# Patient Record
Sex: Male | Born: 1935 | Race: White | Hispanic: No | Smoking: Former smoker
Health system: Southern US, Community
[De-identification: ages and names within clinical notes are randomized; demographics above are authoritative.]

## PROBLEM LIST (undated history)

## (undated) DIAGNOSIS — J986 Disorders of diaphragm: Secondary | ICD-10-CM

## (undated) DIAGNOSIS — I714 Abdominal aortic aneurysm, without rupture, unspecified: Secondary | ICD-10-CM

## (undated) DIAGNOSIS — Z77090 Contact with and (suspected) exposure to asbestos: Secondary | ICD-10-CM

## (undated) DIAGNOSIS — I519 Heart disease, unspecified: Secondary | ICD-10-CM

## (undated) DIAGNOSIS — R0602 Shortness of breath: Secondary | ICD-10-CM

## (undated) DIAGNOSIS — I2510 Atherosclerotic heart disease of native coronary artery without angina pectoris: Secondary | ICD-10-CM

## (undated) DIAGNOSIS — M1990 Unspecified osteoarthritis, unspecified site: Secondary | ICD-10-CM

## (undated) DIAGNOSIS — N400 Enlarged prostate without lower urinary tract symptoms: Secondary | ICD-10-CM

## (undated) DIAGNOSIS — J449 Chronic obstructive pulmonary disease, unspecified: Secondary | ICD-10-CM

## (undated) DIAGNOSIS — R972 Elevated prostate specific antigen [PSA]: Secondary | ICD-10-CM

## (undated) DIAGNOSIS — B009 Herpesviral infection, unspecified: Secondary | ICD-10-CM

## (undated) HISTORY — DX: Elevated prostate specific antigen (PSA): R97.20

## (undated) HISTORY — DX: Shortness of breath: R06.02

## (undated) HISTORY — DX: Abdominal aortic aneurysm, without rupture, unspecified: I71.40

## (undated) HISTORY — DX: Herpesviral infection, unspecified: B00.9

## (undated) HISTORY — DX: Unspecified osteoarthritis, unspecified site: M19.90

## (undated) HISTORY — DX: Benign prostatic hyperplasia without lower urinary tract symptoms: N40.0

## (undated) HISTORY — DX: Disorders of diaphragm: J98.6

## (undated) HISTORY — DX: Chronic obstructive pulmonary disease, unspecified: J44.9

## (undated) HISTORY — DX: Contact with and (suspected) exposure to asbestos: Z77.090

## (undated) HISTORY — DX: Heart disease, unspecified: I51.9

## (undated) HISTORY — DX: Atherosclerotic heart disease of native coronary artery without angina pectoris: I25.10

## (undated) HISTORY — DX: Abdominal aortic aneurysm, without rupture: I71.4

---
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
## 2008-02-25 DIAGNOSIS — B009 Herpesviral infection, unspecified: Secondary | ICD-10-CM

## 2008-02-25 HISTORY — DX: Herpesviral infection, unspecified: B00.9

## 2008-06-09 ENCOUNTER — Ambulatory Visit: Payer: Self-pay | Admitting: Cardiothoracic Surgery

## 2008-06-09 ENCOUNTER — Ambulatory Visit: Payer: Self-pay | Admitting: Internal Medicine

## 2008-06-09 ENCOUNTER — Inpatient Hospital Stay (HOSPITAL_COMMUNITY): Admission: EM | Admit: 2008-06-09 | Discharge: 2008-07-02 | Payer: Self-pay | Admitting: Emergency Medicine

## 2008-06-09 ENCOUNTER — Encounter (INDEPENDENT_AMBULATORY_CARE_PROVIDER_SITE_OTHER): Payer: Self-pay | Admitting: Emergency Medicine

## 2008-06-10 ENCOUNTER — Encounter (INDEPENDENT_AMBULATORY_CARE_PROVIDER_SITE_OTHER): Payer: Self-pay | Admitting: Internal Medicine

## 2008-06-11 ENCOUNTER — Telehealth: Payer: Self-pay | Admitting: Internal Medicine

## 2008-06-17 HISTORY — PX: CARDIAC CATHETERIZATION: SHX172

## 2008-06-19 ENCOUNTER — Ambulatory Visit: Payer: Self-pay | Admitting: Internal Medicine

## 2008-06-20 ENCOUNTER — Encounter (INDEPENDENT_AMBULATORY_CARE_PROVIDER_SITE_OTHER): Payer: Self-pay | Admitting: Internal Medicine

## 2008-06-20 ENCOUNTER — Encounter: Payer: Self-pay | Admitting: Cardiology

## 2008-06-24 HISTORY — PX: CORONARY ARTERY BYPASS GRAFT: SHX141

## 2008-07-25 ENCOUNTER — Encounter: Admission: RE | Admit: 2008-07-25 | Discharge: 2008-07-25 | Payer: Self-pay | Admitting: Cardiothoracic Surgery

## 2008-07-25 ENCOUNTER — Ambulatory Visit: Payer: Self-pay | Admitting: Cardiothoracic Surgery

## 2008-09-03 HISTORY — PX: US ECHOCARDIOGRAPHY: HXRAD669

## 2009-09-15 ENCOUNTER — Ambulatory Visit: Payer: Self-pay | Admitting: Cardiology

## 2009-11-06 ENCOUNTER — Encounter: Admission: RE | Admit: 2009-11-06 | Discharge: 2009-11-06 | Payer: Self-pay | Admitting: Cardiology

## 2010-02-01 IMAGING — CR DG ABDOMEN 1V
2 series · 2 of 2 positions shown · non-contrast
Comparison: 06/13/2008.

CLINICAL DATA: Reassess bowel gas pattern.

ABDOMEN - 1 VIEW

[view not recorded (1 of 2)]
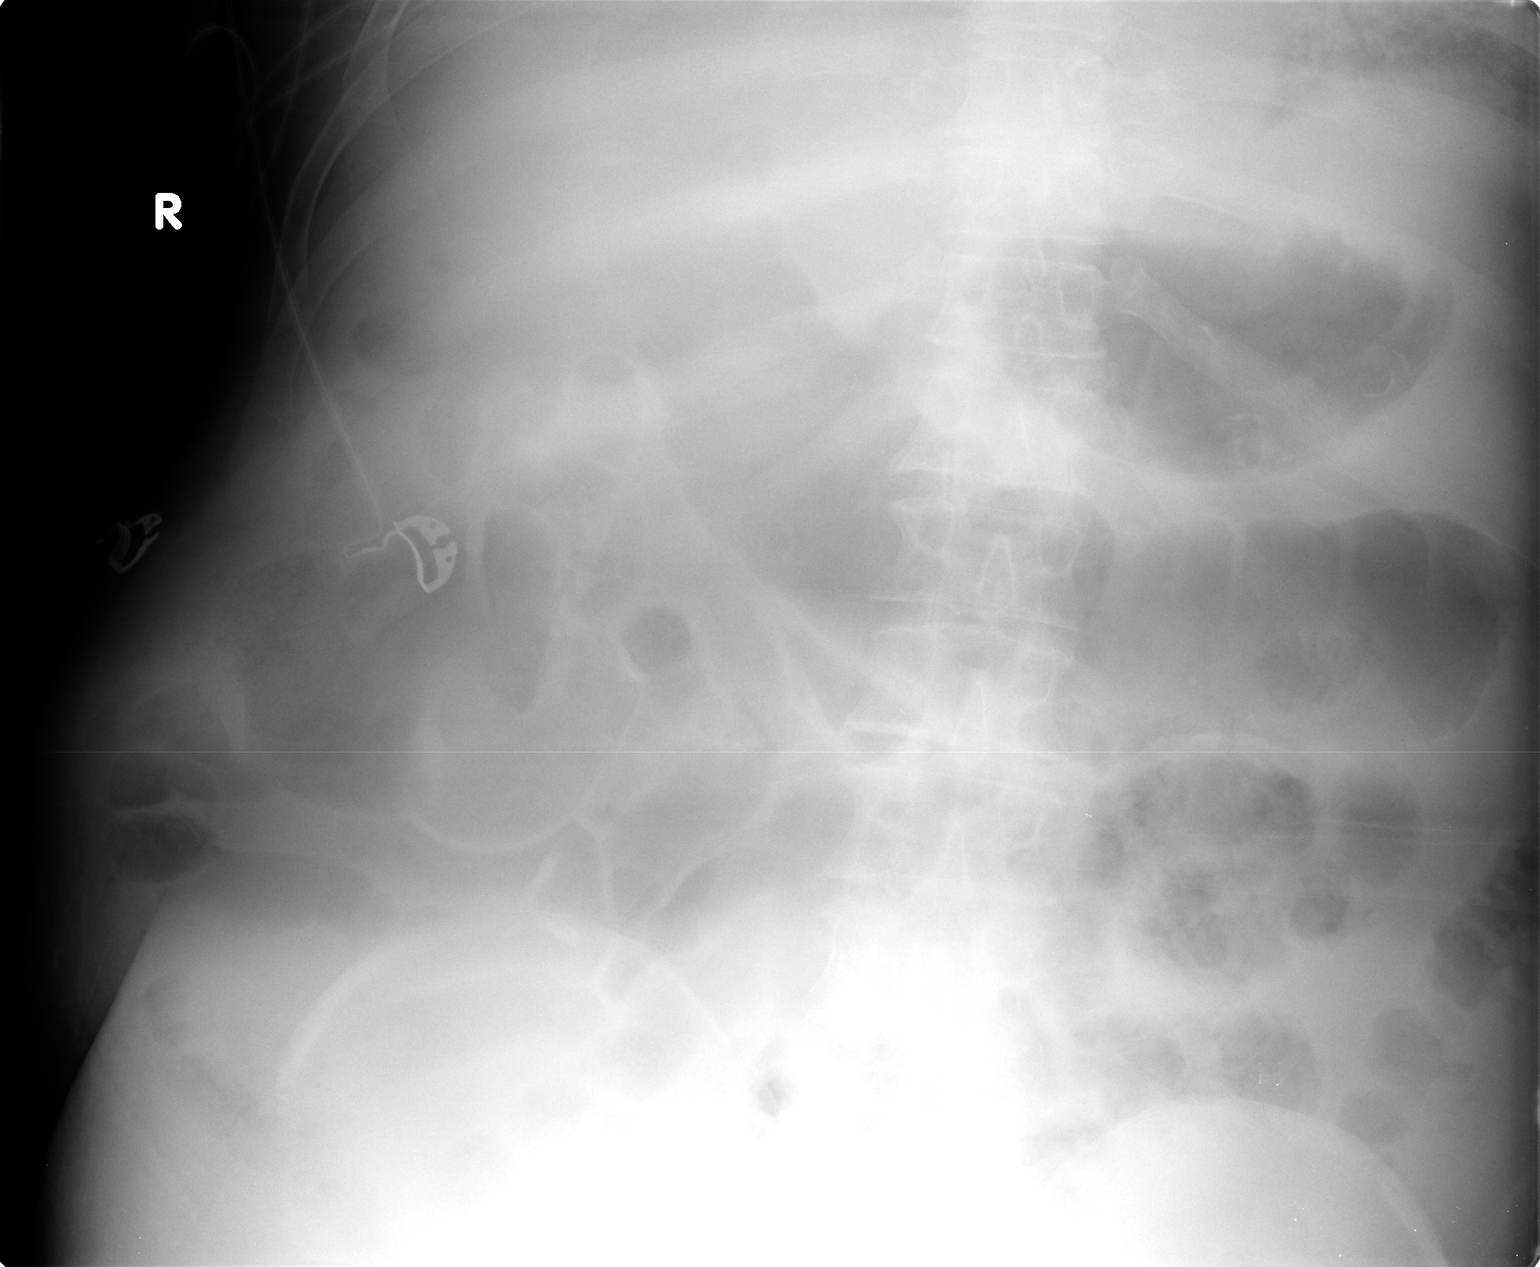

[view not recorded (2 of 2)]
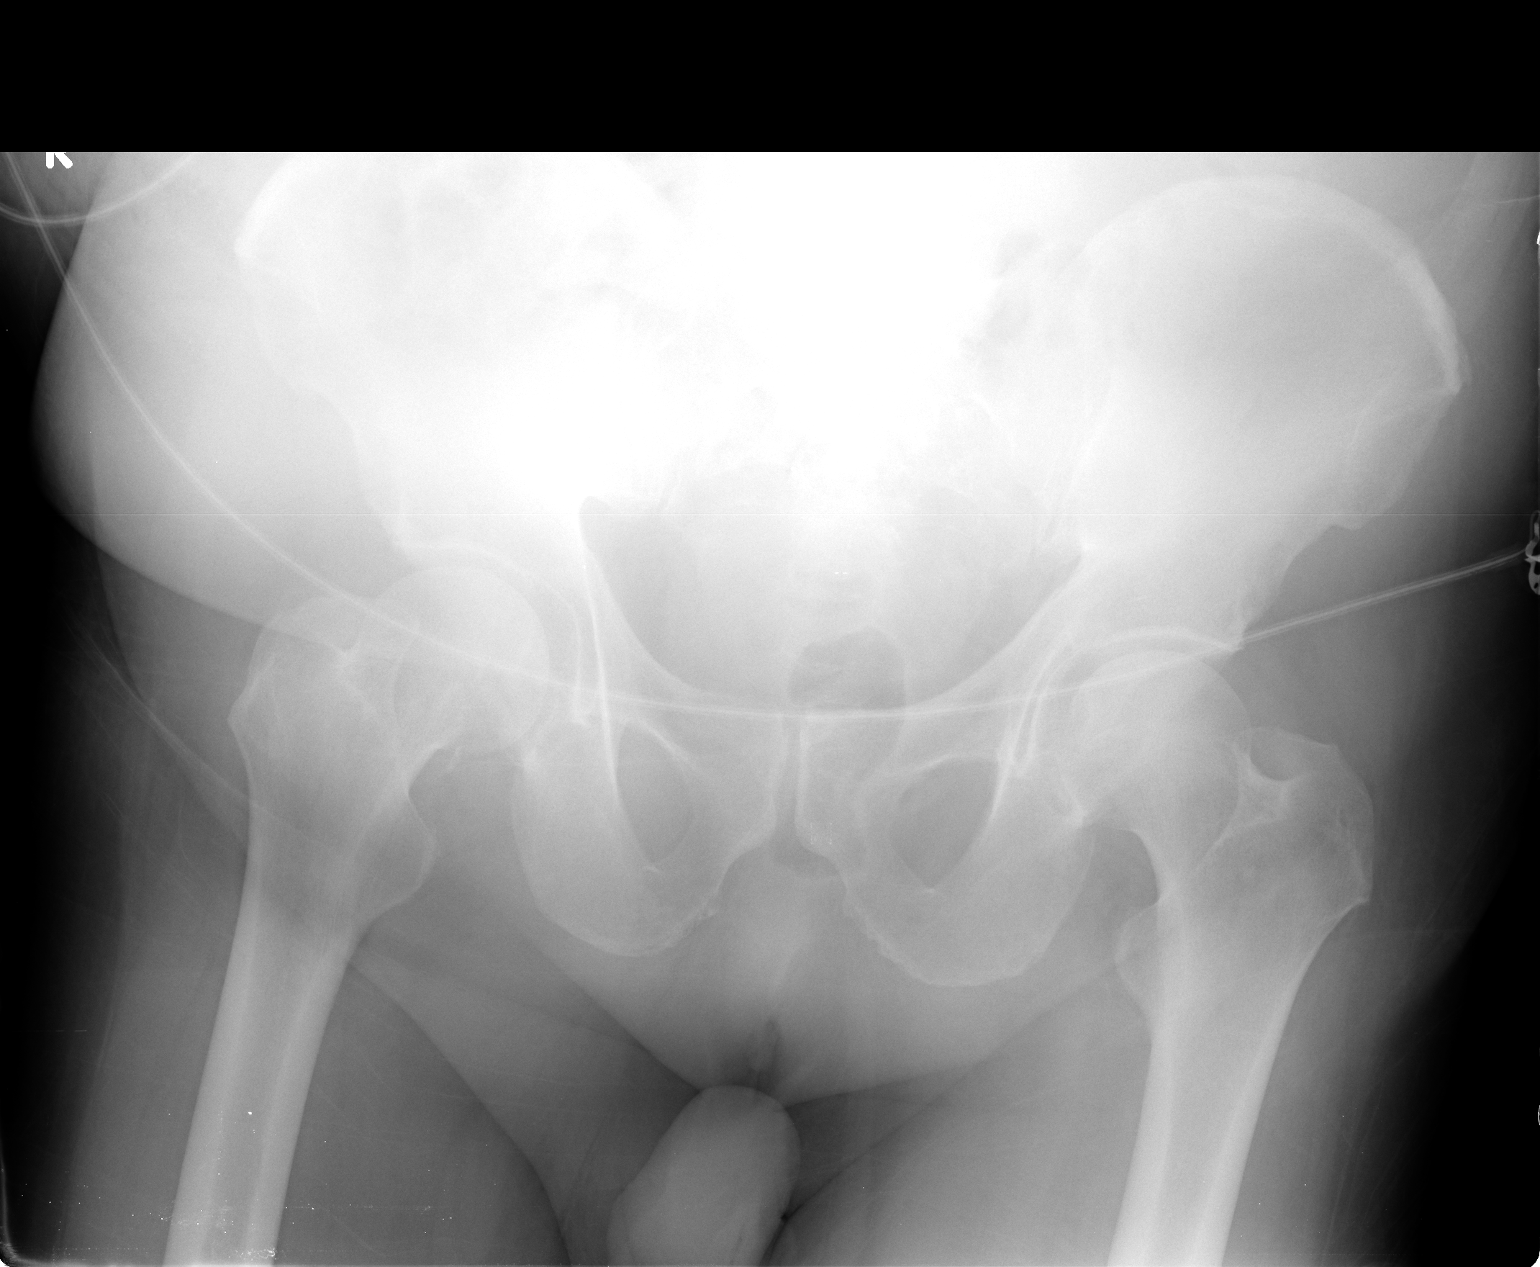

[2 of 2 positions shown; findings below may reference images not displayed]

FINDINGS: Some improvement in colonic distention.  Mild to moderate
gas distention of the transverse colon and multiple small bowel
loops compatible with an ileus.  No definite free air.
IMPRESSION: Some improvement in colonic ileus.  Findings consistent with a
generalized ileus now.

## 2010-02-11 IMAGING — CR DG CHEST 2V
2 series · 2 of 2 positions shown · non-contrast
Comparison: 06/28/2008

CLINICAL DATA: Status post CABG

CHEST - 2 VIEW

[w chest pa]
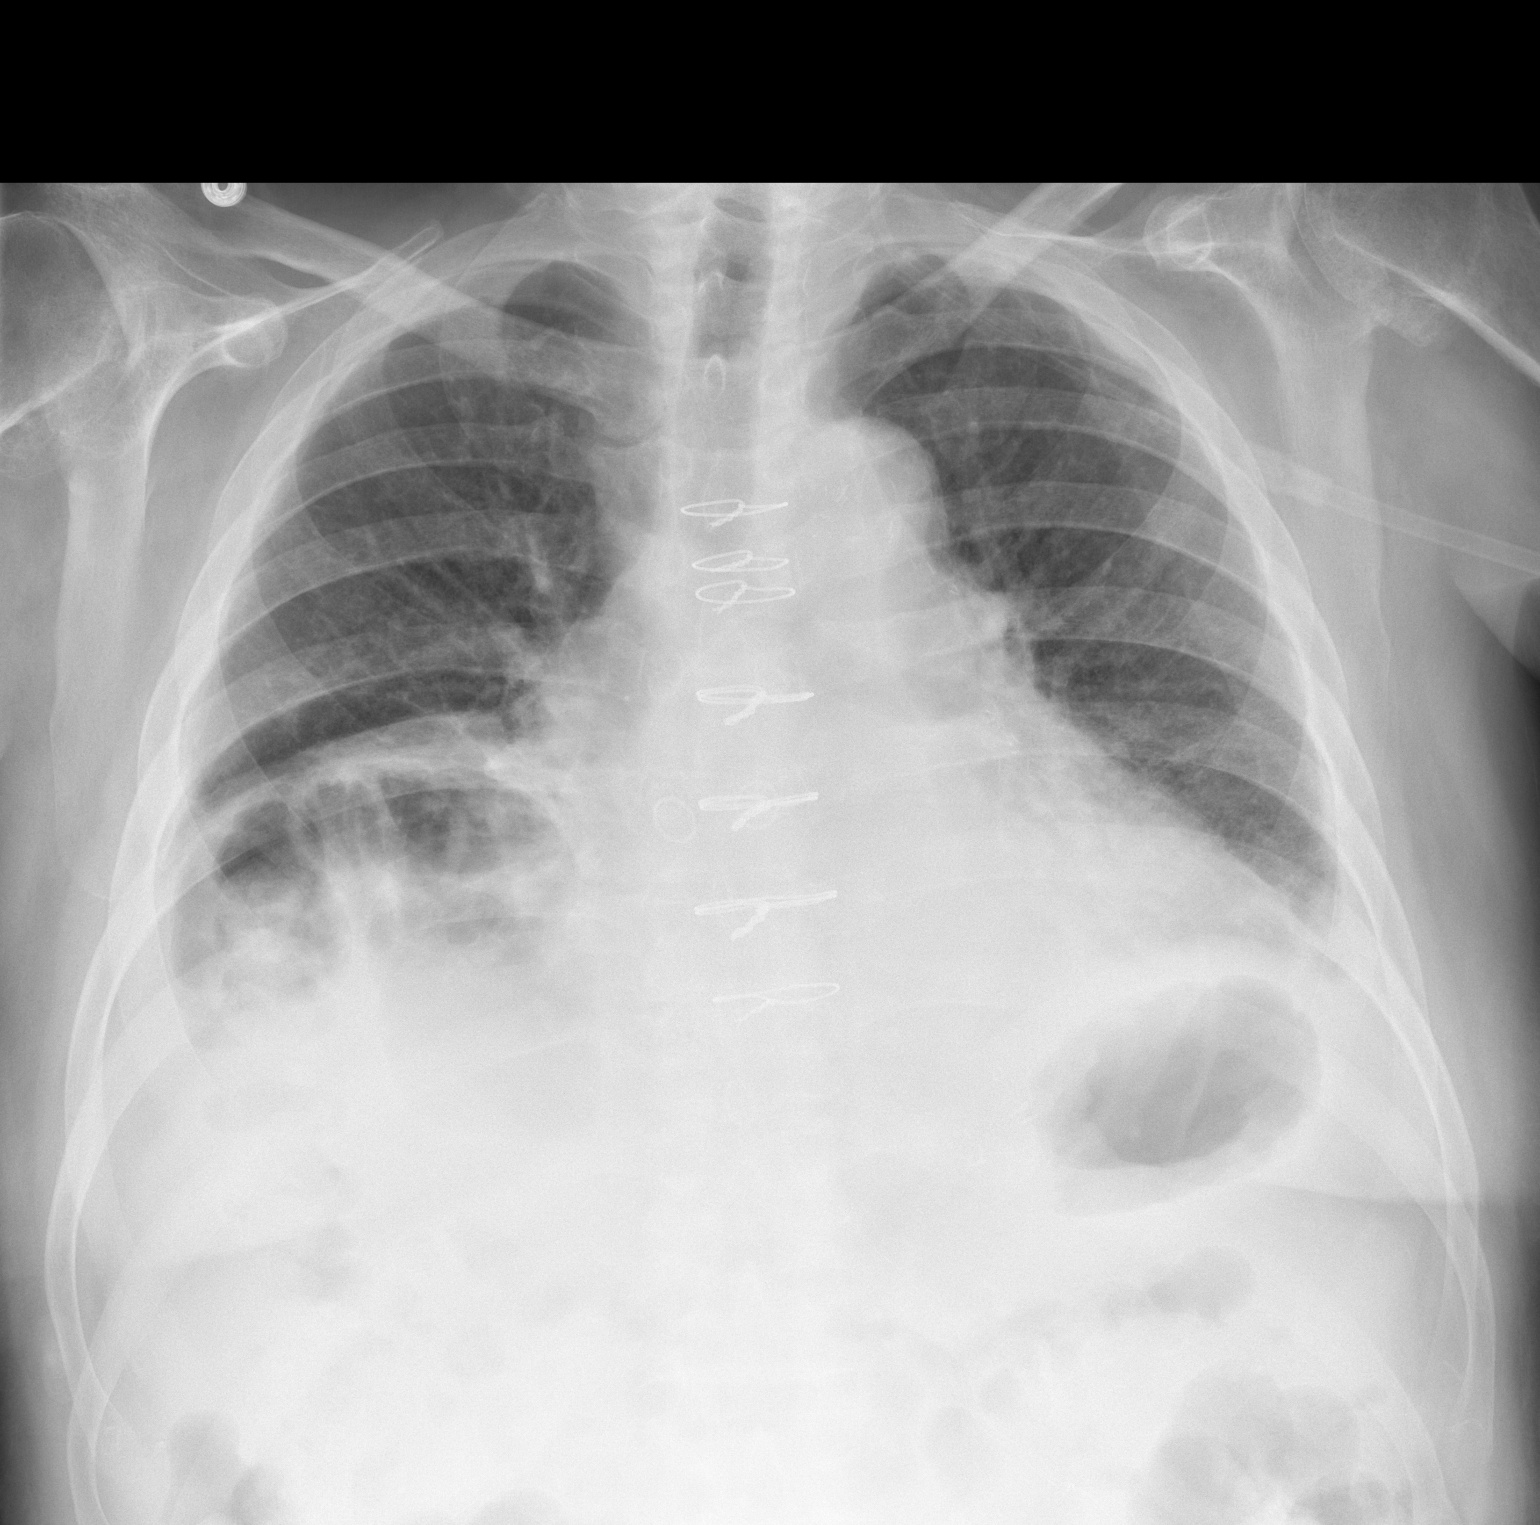

[w chest lat]
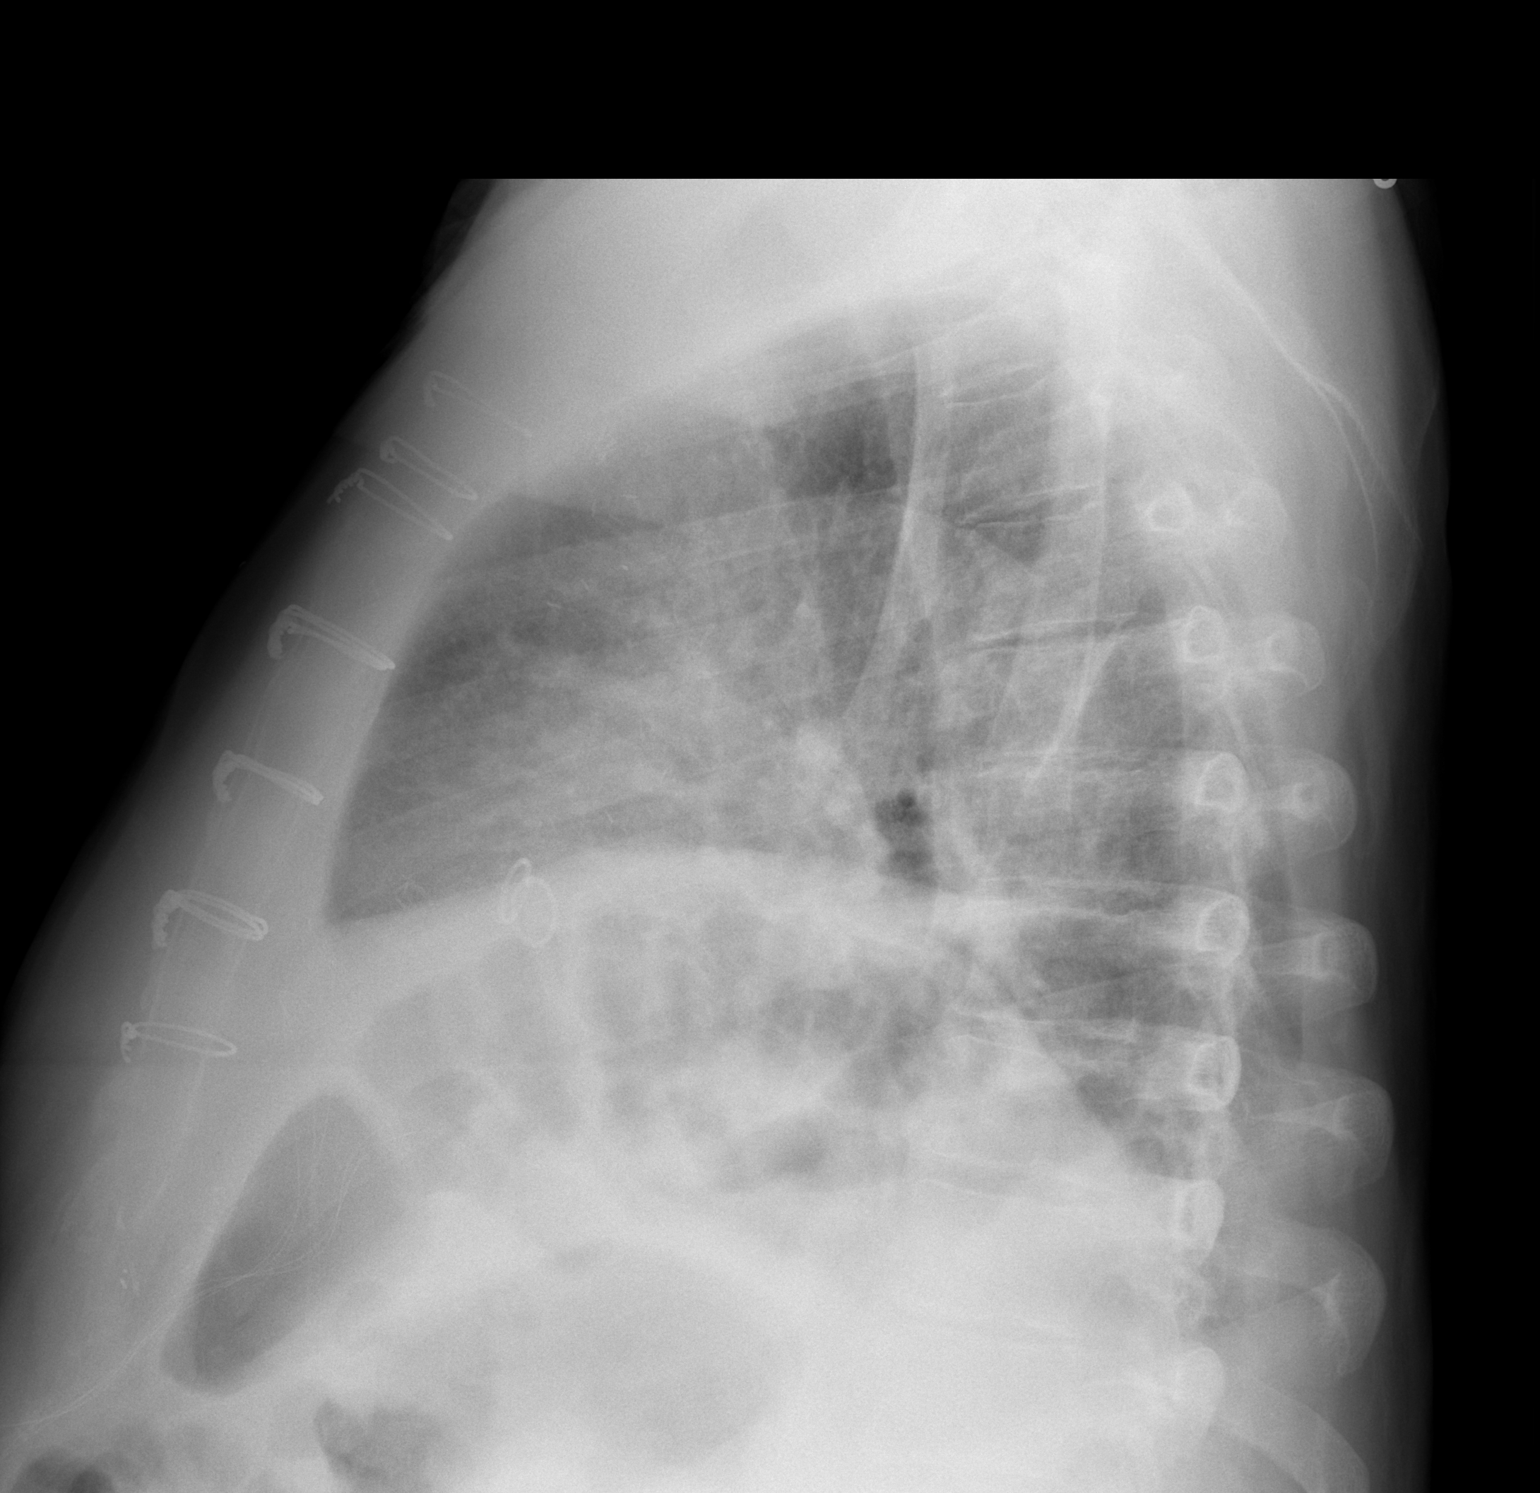

[2 of 2 positions shown; findings below may reference images not displayed]

FINDINGS: Right jugular sheath has been removed.  There is stable
elevation of the right hemidiaphragm.  Left lower lobe atelectasis
shows slight improvement.  No edema, pneumothorax or significant
pleural fluid.  Heart size and mediastinal contours are stable.
IMPRESSION: Improved aeration of left lower lobe.  No pneumothorax.

## 2010-03-19 ENCOUNTER — Ambulatory Visit (INDEPENDENT_AMBULATORY_CARE_PROVIDER_SITE_OTHER): Payer: Medicare Other | Admitting: Cardiology

## 2010-03-19 DIAGNOSIS — I2510 Atherosclerotic heart disease of native coronary artery without angina pectoris: Secondary | ICD-10-CM

## 2010-05-03 LAB — BASIC METABOLIC PANEL
BUN: 10 mg/dL (ref 6–23)
BUN: 11 mg/dL (ref 6–23)
BUN: 7 mg/dL (ref 6–23)
BUN: 7 mg/dL (ref 6–23)
BUN: 9 mg/dL (ref 6–23)
CO2: 26 mEq/L (ref 19–32)
CO2: 29 mEq/L (ref 19–32)
CO2: 30 mEq/L (ref 19–32)
CO2: 30 mEq/L (ref 19–32)
CO2: 31 mEq/L (ref 19–32)
Calcium: 7.5 mg/dL — ABNORMAL LOW (ref 8.4–10.5)
Calcium: 7.8 mg/dL — ABNORMAL LOW (ref 8.4–10.5)
Calcium: 8.1 mg/dL — ABNORMAL LOW (ref 8.4–10.5)
Calcium: 8.4 mg/dL (ref 8.4–10.5)
Calcium: 8.7 mg/dL (ref 8.4–10.5)
Chloride: 104 mEq/L (ref 96–112)
Chloride: 105 mEq/L (ref 96–112)
Chloride: 108 mEq/L (ref 96–112)
Chloride: 108 mEq/L (ref 96–112)
Chloride: 109 mEq/L (ref 96–112)
Creatinine, Ser: 0.64 mg/dL (ref 0.4–1.5)
Creatinine, Ser: 0.7 mg/dL (ref 0.4–1.5)
Creatinine, Ser: 0.71 mg/dL (ref 0.4–1.5)
Creatinine, Ser: 0.77 mg/dL (ref 0.4–1.5)
Creatinine, Ser: 0.82 mg/dL (ref 0.4–1.5)
GFR calc Af Amer: >60 mL/min (ref >60)
GFR calc Af Amer: >60 mL/min (ref >60)
GFR calc Af Amer: >60 mL/min (ref >60)
GFR calc Af Amer: >60 mL/min (ref >60)
GFR calc Af Amer: >60 mL/min (ref >60)
GFR calc non Af Amer: >60 mL/min (ref >60)
GFR calc non Af Amer: >60 mL/min (ref >60)
GFR calc non Af Amer: >60 mL/min (ref >60)
GFR calc non Af Amer: >60 mL/min (ref >60)
GFR calc non Af Amer: >60 mL/min (ref >60)
Glucose, Bld: 101 mg/dL — ABNORMAL HIGH (ref 70–99)
Glucose, Bld: 104 mg/dL — ABNORMAL HIGH (ref 70–99)
Glucose, Bld: 107 mg/dL — ABNORMAL HIGH (ref 70–99)
Glucose, Bld: 107 mg/dL — ABNORMAL HIGH (ref 70–99)
Glucose, Bld: 95 mg/dL (ref 70–99)
Potassium: 3.6 mEq/L (ref 3.5–5.1)
Potassium: 3.7 mEq/L (ref 3.5–5.1)
Potassium: 3.8 mEq/L (ref 3.5–5.1)
Potassium: 3.9 mEq/L (ref 3.5–5.1)
Potassium: 4 mEq/L (ref 3.5–5.1)
Sodium: 138 mEq/L (ref 135–145)
Sodium: 139 mEq/L (ref 135–145)
Sodium: 141 mEq/L (ref 135–145)
Sodium: 143 mEq/L (ref 135–145)
Sodium: 143 mEq/L (ref 135–145)

## 2010-05-03 LAB — CBC
HCT: 26.1 % — ABNORMAL LOW (ref 39.0–52.0)
HCT: 27.4 % — ABNORMAL LOW (ref 39.0–52.0)
HCT: 27.5 % — ABNORMAL LOW (ref 39.0–52.0)
HCT: 27.8 % — ABNORMAL LOW (ref 39.0–52.0)
HCT: 27.9 % — ABNORMAL LOW (ref 39.0–52.0)
HCT: 28.3 % — ABNORMAL LOW (ref 39.0–52.0)
HCT: 28.7 % — ABNORMAL LOW (ref 39.0–52.0)
HCT: 29.1 % — ABNORMAL LOW (ref 39.0–52.0)
HCT: 32.3 % — ABNORMAL LOW (ref 39.0–52.0)
Hemoglobin: 10.6 g/dL — ABNORMAL LOW (ref 13.0–17.0)
Hemoglobin: 8.6 g/dL — ABNORMAL LOW (ref 13.0–17.0)
Hemoglobin: 9.1 g/dL — ABNORMAL LOW (ref 13.0–17.0)
Hemoglobin: 9.2 g/dL — ABNORMAL LOW (ref 13.0–17.0)
Hemoglobin: 9.3 g/dL — ABNORMAL LOW (ref 13.0–17.0)
Hemoglobin: 9.3 g/dL — ABNORMAL LOW (ref 13.0–17.0)
Hemoglobin: 9.4 g/dL — ABNORMAL LOW (ref 13.0–17.0)
Hemoglobin: 9.5 g/dL — ABNORMAL LOW (ref 13.0–17.0)
Hemoglobin: 9.7 g/dL — ABNORMAL LOW (ref 13.0–17.0)
Hemoglobin: 9.7 g/dL — ABNORMAL LOW (ref 13.0–17.0)
MCHC: 33 g/dL (ref 30.0–36.0)
MCHC: 33 g/dL (ref 30.0–36.0)
MCHC: 33.1 g/dL (ref 30.0–36.0)
MCHC: 33.1 g/dL (ref 30.0–36.0)
MCHC: 33.1 g/dL (ref 30.0–36.0)
MCHC: 33.3 g/dL (ref 30.0–36.0)
MCHC: 33.4 g/dL (ref 30.0–36.0)
MCHC: 33.4 g/dL (ref 30.0–36.0)
MCHC: 33.5 g/dL (ref 30.0–36.0)
MCHC: 33.5 g/dL (ref 30.0–36.0)
MCV: 80.4 fL (ref 78.0–100.0)
MCV: 80.7 fL (ref 78.0–100.0)
MCV: 81 fL (ref 78.0–100.0)
MCV: 81.1 fL (ref 78.0–100.0)
MCV: 81.6 fL (ref 78.0–100.0)
MCV: 81.6 fL (ref 78.0–100.0)
MCV: 81.9 fL (ref 78.0–100.0)
MCV: 81.9 fL (ref 78.0–100.0)
MCV: 82 fL (ref 78.0–100.0)
Platelets: 182 10*3/uL (ref 150–400)
Platelets: 183 10*3/uL (ref 150–400)
Platelets: 187 10*3/uL (ref 150–400)
Platelets: 188 10*3/uL (ref 150–400)
Platelets: 218 10*3/uL (ref 150–400)
Platelets: 228 10*3/uL (ref 150–400)
Platelets: 238 10*3/uL (ref 150–400)
Platelets: 256 10*3/uL (ref 150–400)
Platelets: 358 10*3/uL (ref 150–400)
RBC: 3.19 MIL/uL — ABNORMAL LOW (ref 4.22–5.81)
RBC: 3.36 MIL/uL — ABNORMAL LOW (ref 4.22–5.81)
RBC: 3.39 MIL/uL — ABNORMAL LOW (ref 4.22–5.81)
RBC: 3.4 MIL/uL — ABNORMAL LOW (ref 4.22–5.81)
RBC: 3.41 MIL/uL — ABNORMAL LOW (ref 4.22–5.81)
RBC: 3.46 MIL/uL — ABNORMAL LOW (ref 4.22–5.81)
RBC: 3.58 MIL/uL — ABNORMAL LOW (ref 4.22–5.81)
RBC: 3.61 MIL/uL — ABNORMAL LOW (ref 4.22–5.81)
RBC: 3.99 MIL/uL — ABNORMAL LOW (ref 4.22–5.81)
RDW: 16.4 % — ABNORMAL HIGH (ref 11.5–15.5)
RDW: 16.4 % — ABNORMAL HIGH (ref 11.5–15.5)
RDW: 16.6 % — ABNORMAL HIGH (ref 11.5–15.5)
RDW: 16.7 % — ABNORMAL HIGH (ref 11.5–15.5)
RDW: 16.9 % — ABNORMAL HIGH (ref 11.5–15.5)
RDW: 16.9 % — ABNORMAL HIGH (ref 11.5–15.5)
RDW: 17 % — ABNORMAL HIGH (ref 11.5–15.5)
RDW: 17.2 % — ABNORMAL HIGH (ref 11.5–15.5)
RDW: 17.8 % — ABNORMAL HIGH (ref 11.5–15.5)
RDW: 18.2 % — ABNORMAL HIGH (ref 11.5–15.5)
WBC: 10.5 10*3/uL (ref 4.0–10.5)
WBC: 10.7 10*3/uL — ABNORMAL HIGH (ref 4.0–10.5)
WBC: 14.7 10*3/uL — ABNORMAL HIGH (ref 4.0–10.5)
WBC: 22 10*3/uL — ABNORMAL HIGH (ref 4.0–10.5)
WBC: 7.5 10*3/uL (ref 4.0–10.5)
WBC: 8.5 10*3/uL (ref 4.0–10.5)
WBC: 8.7 10*3/uL (ref 4.0–10.5)
WBC: 9.1 10*3/uL (ref 4.0–10.5)
WBC: 9.6 10*3/uL (ref 4.0–10.5)

## 2010-05-03 LAB — COMPREHENSIVE METABOLIC PANEL
ALT: 12 U/L (ref 0–53)
ALT: <8 U/L (ref 0–53)
AST: 20 U/L (ref 0–37)
Albumin: 2.3 g/dL — ABNORMAL LOW (ref 3.5–5.2)
Alkaline Phosphatase: 61 U/L (ref 39–117)
BUN: 6 mg/dL (ref 6–23)
CO2: 29 mEq/L (ref 19–32)
Calcium: 8.5 mg/dL (ref 8.4–10.5)
Calcium: 9 mg/dL (ref 8.4–10.5)
Chloride: 106 mEq/L (ref 96–112)
Creatinine, Ser: 0.76 mg/dL (ref 0.4–1.5)
Creatinine, Ser: 0.86 mg/dL (ref 0.4–1.5)
GFR calc Af Amer: >60 mL/min (ref >60)
GFR calc Af Amer: >60 mL/min (ref >60)
GFR calc non Af Amer: >60 mL/min (ref >60)
Glucose, Bld: 105 mg/dL — ABNORMAL HIGH (ref 70–99)
Glucose, Bld: 112 mg/dL — ABNORMAL HIGH (ref 70–99)
Potassium: 4.4 mEq/L (ref 3.5–5.1)
Sodium: 140 mEq/L (ref 135–145)
Sodium: 143 mEq/L (ref 135–145)
Total Bilirubin: 0.3 mg/dL (ref 0.3–1.2)
Total Protein: 5.9 g/dL — ABNORMAL LOW (ref 6.0–8.3)
Total Protein: 6.2 g/dL (ref 6.0–8.3)

## 2010-05-03 LAB — POCT I-STAT 3, ART BLOOD GAS (G3+)
Acid-Base Excess: 1 mmol/L (ref 0.0–2.0)
Acid-base deficit: 1 mmol/L (ref 0.0–2.0)
Bicarbonate: 25.1 mEq/L — ABNORMAL HIGH (ref 20.0–24.0)
Bicarbonate: 25.6 mEq/L — ABNORMAL HIGH (ref 20.0–24.0)
Bicarbonate: 26.5 mEq/L — ABNORMAL HIGH (ref 20.0–24.0)
O2 Saturation: 100 %
O2 Saturation: 94 %
Patient temperature: 36.7
TCO2: 26 mmol/L (ref 0–100)
TCO2: 27 mmol/L (ref 0–100)
TCO2: 28 mmol/L (ref 0–100)
pCO2 arterial: 44.6 mmHg (ref 35.0–45.0)
pH, Arterial: 7.331 — ABNORMAL LOW (ref 7.350–7.450)
pH, Arterial: 7.361 (ref 7.350–7.450)
pH, Arterial: 7.373 (ref 7.350–7.450)
pH, Arterial: 7.386 (ref 7.350–7.450)
pH, Arterial: 7.396 (ref 7.350–7.450)
pH, Arterial: 7.401 (ref 7.350–7.450)
pH, Arterial: 7.41 (ref 7.350–7.450)
pO2, Arterial: 314 mmHg — ABNORMAL HIGH (ref 80.0–100.0)
pO2, Arterial: 72 mmHg — ABNORMAL LOW (ref 80.0–100.0)

## 2010-05-03 LAB — POCT I-STAT, CHEM 8
BUN: 10 mg/dL (ref 6–23)
BUN: 8 mg/dL (ref 6–23)
Calcium, Ion: 1.18 mmol/L (ref 1.12–1.32)
Creatinine, Ser: 1 mg/dL (ref 0.4–1.5)
Glucose, Bld: 143 mg/dL — ABNORMAL HIGH (ref 70–99)
HCT: 31 % — ABNORMAL LOW (ref 39.0–52.0)
Hemoglobin: 10.5 g/dL — ABNORMAL LOW (ref 13.0–17.0)
Sodium: 138 mEq/L (ref 135–145)
TCO2: 27 mmol/L (ref 0–100)

## 2010-05-03 LAB — GLUCOSE, CAPILLARY
Glucose-Capillary: 104 mg/dL — ABNORMAL HIGH (ref 70–99)
Glucose-Capillary: 105 mg/dL — ABNORMAL HIGH (ref 70–99)
Glucose-Capillary: 107 mg/dL — ABNORMAL HIGH (ref 70–99)
Glucose-Capillary: 108 mg/dL — ABNORMAL HIGH (ref 70–99)
Glucose-Capillary: 108 mg/dL — ABNORMAL HIGH (ref 70–99)
Glucose-Capillary: 109 mg/dL — ABNORMAL HIGH (ref 70–99)
Glucose-Capillary: 112 mg/dL — ABNORMAL HIGH (ref 70–99)
Glucose-Capillary: 112 mg/dL — ABNORMAL HIGH (ref 70–99)
Glucose-Capillary: 117 mg/dL — ABNORMAL HIGH (ref 70–99)
Glucose-Capillary: 128 mg/dL — ABNORMAL HIGH (ref 70–99)
Glucose-Capillary: 129 mg/dL — ABNORMAL HIGH (ref 70–99)
Glucose-Capillary: 129 mg/dL — ABNORMAL HIGH (ref 70–99)
Glucose-Capillary: 132 mg/dL — ABNORMAL HIGH (ref 70–99)
Glucose-Capillary: 143 mg/dL — ABNORMAL HIGH (ref 70–99)
Glucose-Capillary: 68 mg/dL — ABNORMAL LOW (ref 70–99)
Glucose-Capillary: 74 mg/dL (ref 70–99)
Glucose-Capillary: 88 mg/dL (ref 70–99)
Glucose-Capillary: 89 mg/dL (ref 70–99)
Glucose-Capillary: 99 mg/dL (ref 70–99)
Glucose-Capillary: 99 mg/dL (ref 70–99)
Glucose-Capillary: 99 mg/dL (ref 70–99)

## 2010-05-03 LAB — POCT I-STAT 3, VENOUS BLOOD GAS (G3P V)
Acid-base deficit: 1 mmol/L (ref 0.0–2.0)
Acid-base deficit: 1 mmol/L (ref 0.0–2.0)
O2 Saturation: 72 %
Patient temperature: 36
pH, Ven: 7.272 (ref 7.250–7.300)
pO2, Ven: 43 mmHg (ref 30.0–45.0)

## 2010-05-03 LAB — CREATININE, SERUM
Creatinine, Ser: 0.7 mg/dL (ref 0.4–1.5)
Creatinine, Ser: 0.82 mg/dL (ref 0.4–1.5)
GFR calc Af Amer: >60 mL/min (ref >60)
GFR calc Af Amer: >60 mL/min (ref >60)
GFR calc non Af Amer: >60 mL/min (ref >60)
GFR calc non Af Amer: >60 mL/min (ref >60)

## 2010-05-03 LAB — POCT I-STAT 4, (NA,K, GLUC, HGB,HCT)
Glucose, Bld: 121 mg/dL — ABNORMAL HIGH (ref 70–99)
Glucose, Bld: 128 mg/dL — ABNORMAL HIGH (ref 70–99)
Glucose, Bld: 142 mg/dL — ABNORMAL HIGH (ref 70–99)
Glucose, Bld: 142 mg/dL — ABNORMAL HIGH (ref 70–99)
Glucose, Bld: 148 mg/dL — ABNORMAL HIGH (ref 70–99)
HCT: 22 % — ABNORMAL LOW (ref 39.0–52.0)
HCT: 26 % — ABNORMAL LOW (ref 39.0–52.0)
HCT: 26 % — ABNORMAL LOW (ref 39.0–52.0)
HCT: 27 % — ABNORMAL LOW (ref 39.0–52.0)
HCT: 34 % — ABNORMAL LOW (ref 39.0–52.0)
Hemoglobin: 11.6 g/dL — ABNORMAL LOW (ref 13.0–17.0)
Hemoglobin: 6.8 g/dL — CL (ref 13.0–17.0)
Hemoglobin: 7.5 g/dL — CL (ref 13.0–17.0)
Hemoglobin: 8.8 g/dL — ABNORMAL LOW (ref 13.0–17.0)
Hemoglobin: 9.2 g/dL — ABNORMAL LOW (ref 13.0–17.0)
Potassium: 4.1 mEq/L (ref 3.5–5.1)
Potassium: 4.1 mEq/L (ref 3.5–5.1)
Potassium: 4.1 mEq/L (ref 3.5–5.1)
Potassium: 4.2 mEq/L (ref 3.5–5.1)
Potassium: 4.3 mEq/L (ref 3.5–5.1)
Potassium: 4.6 mEq/L (ref 3.5–5.1)
Potassium: 4.9 mEq/L (ref 3.5–5.1)
Potassium: 5.2 mEq/L — ABNORMAL HIGH (ref 3.5–5.1)
Sodium: 133 mEq/L — ABNORMAL LOW (ref 135–145)
Sodium: 135 mEq/L (ref 135–145)
Sodium: 138 mEq/L (ref 135–145)
Sodium: 140 mEq/L (ref 135–145)
Sodium: 140 mEq/L (ref 135–145)

## 2010-05-03 LAB — DIFFERENTIAL
Eosinophils Absolute: 0.2 10*3/uL (ref 0.0–0.7)
Lymphocytes Relative: 21 % (ref 12–46)
Lymphs Abs: 1.5 10*3/uL (ref 0.7–4.0)
Monocytes Relative: 8 % (ref 3–12)
Neutrophils Relative %: 68 % (ref 43–77)

## 2010-05-03 LAB — PREPARE FRESH FROZEN PLASMA

## 2010-05-03 LAB — APTT: aPTT: 33 seconds (ref 24–37)

## 2010-05-03 LAB — MAGNESIUM
Magnesium: 2.6 mg/dL — ABNORMAL HIGH (ref 1.5–2.5)
Magnesium: 2.7 mg/dL — ABNORMAL HIGH (ref 1.5–2.5)
Magnesium: 3.1 mg/dL — ABNORMAL HIGH (ref 1.5–2.5)

## 2010-05-03 LAB — PROTIME-INR
INR: 1.3 (ref 0.00–1.49)
Prothrombin Time: 16.4 seconds — ABNORMAL HIGH (ref 11.6–15.2)

## 2010-05-04 LAB — BASIC METABOLIC PANEL
BUN: 18 mg/dL (ref 6–23)
CO2: 22 mEq/L (ref 19–32)
CO2: 24 mEq/L (ref 19–32)
CO2: 26 mEq/L (ref 19–32)
CO2: 26 mEq/L (ref 19–32)
Calcium: 8.5 mg/dL (ref 8.4–10.5)
Calcium: 8.7 mg/dL (ref 8.4–10.5)
Chloride: 101 mEq/L (ref 96–112)
Chloride: 106 mEq/L (ref 96–112)
Creatinine, Ser: 1.01 mg/dL (ref 0.4–1.5)
Creatinine, Ser: 1.11 mg/dL (ref 0.4–1.5)
GFR calc Af Amer: >60 mL/min (ref >60)
GFR calc Af Amer: >60 mL/min (ref >60)
GFR calc non Af Amer: >60 mL/min (ref >60)
Glucose, Bld: 114 mg/dL — ABNORMAL HIGH (ref 70–99)
Glucose, Bld: 120 mg/dL — ABNORMAL HIGH (ref 70–99)
Glucose, Bld: 148 mg/dL — ABNORMAL HIGH (ref 70–99)
Glucose, Bld: 87 mg/dL (ref 70–99)
Potassium: 4.1 mEq/L (ref 3.5–5.1)
Potassium: 4.2 mEq/L (ref 3.5–5.1)
Sodium: 140 mEq/L (ref 135–145)
Sodium: 140 mEq/L (ref 135–145)
Sodium: 143 mEq/L (ref 135–145)

## 2010-05-04 LAB — PREPARE PLATELETS

## 2010-05-04 LAB — LIPASE, BLOOD: Lipase: 17 U/L (ref 11–59)

## 2010-05-04 LAB — POCT I-STAT 3, ART BLOOD GAS (G3+)
Bicarbonate: 21.1 mEq/L (ref 20.0–24.0)
Patient temperature: 37
TCO2: 22 mmol/L (ref 0–100)
pCO2 arterial: 29.8 mmHg — ABNORMAL LOW (ref 35.0–45.0)
pH, Arterial: 7.458 — ABNORMAL HIGH (ref 7.350–7.450)

## 2010-05-04 LAB — CBC
HCT: 29.1 % — ABNORMAL LOW (ref 39.0–52.0)
HCT: 29.2 % — ABNORMAL LOW (ref 39.0–52.0)
HCT: 29.7 % — ABNORMAL LOW (ref 39.0–52.0)
HCT: 30.1 % — ABNORMAL LOW (ref 39.0–52.0)
HCT: 30.2 % — ABNORMAL LOW (ref 39.0–52.0)
HCT: 30.4 % — ABNORMAL LOW (ref 39.0–52.0)
HCT: 30.5 % — ABNORMAL LOW (ref 39.0–52.0)
HCT: 31.5 % — ABNORMAL LOW (ref 39.0–52.0)
HCT: 35.9 % — ABNORMAL LOW (ref 39.0–52.0)
Hemoglobin: 10 g/dL — ABNORMAL LOW (ref 13.0–17.0)
Hemoglobin: 10.1 g/dL — ABNORMAL LOW (ref 13.0–17.0)
Hemoglobin: 10.2 g/dL — ABNORMAL LOW (ref 13.0–17.0)
Hemoglobin: 10.2 g/dL — ABNORMAL LOW (ref 13.0–17.0)
Hemoglobin: 10.7 g/dL — ABNORMAL LOW (ref 13.0–17.0)
Hemoglobin: 12.1 g/dL — ABNORMAL LOW (ref 13.0–17.0)
MCHC: 33.1 g/dL (ref 30.0–36.0)
MCHC: 33.3 g/dL (ref 30.0–36.0)
MCHC: 33.6 g/dL (ref 30.0–36.0)
MCHC: 33.8 g/dL (ref 30.0–36.0)
MCHC: 33.8 g/dL (ref 30.0–36.0)
MCHC: 33.9 g/dL (ref 30.0–36.0)
MCHC: 34.2 g/dL (ref 30.0–36.0)
MCV: 79.6 fL (ref 78.0–100.0)
MCV: 80.2 fL (ref 78.0–100.0)
MCV: 80.3 fL (ref 78.0–100.0)
MCV: 80.4 fL (ref 78.0–100.0)
MCV: 80.5 fL (ref 78.0–100.0)
MCV: 80.5 fL (ref 78.0–100.0)
MCV: 80.9 fL (ref 78.0–100.0)
MCV: 81.3 fL (ref 78.0–100.0)
MCV: 81.4 fL (ref 78.0–100.0)
Platelets: 291 10*3/uL (ref 150–400)
Platelets: 309 10*3/uL (ref 150–400)
Platelets: 326 10*3/uL (ref 150–400)
Platelets: 331 10*3/uL (ref 150–400)
Platelets: 343 10*3/uL (ref 150–400)
Platelets: 346 10*3/uL (ref 150–400)
Platelets: 358 10*3/uL (ref 150–400)
Platelets: 399 10*3/uL (ref 150–400)
RBC: 3.6 MIL/uL — ABNORMAL LOW (ref 4.22–5.81)
RBC: 3.69 MIL/uL — ABNORMAL LOW (ref 4.22–5.81)
RBC: 3.73 MIL/uL — ABNORMAL LOW (ref 4.22–5.81)
RBC: 3.76 MIL/uL — ABNORMAL LOW (ref 4.22–5.81)
RBC: 3.79 MIL/uL — ABNORMAL LOW (ref 4.22–5.81)
RBC: 4.01 MIL/uL — ABNORMAL LOW (ref 4.22–5.81)
RBC: 4.41 MIL/uL (ref 4.22–5.81)
RDW: 15.4 % (ref 11.5–15.5)
RDW: 15.5 % (ref 11.5–15.5)
RDW: 15.5 % (ref 11.5–15.5)
RDW: 15.7 % — ABNORMAL HIGH (ref 11.5–15.5)
RDW: 15.8 % — ABNORMAL HIGH (ref 11.5–15.5)
RDW: 16.1 % — ABNORMAL HIGH (ref 11.5–15.5)
RDW: 16.3 % — ABNORMAL HIGH (ref 11.5–15.5)
WBC: 6.4 10*3/uL (ref 4.0–10.5)
WBC: 6.8 10*3/uL (ref 4.0–10.5)
WBC: 7.4 10*3/uL (ref 4.0–10.5)
WBC: 7.8 10*3/uL (ref 4.0–10.5)
WBC: 8.1 10*3/uL (ref 4.0–10.5)
WBC: 8.5 10*3/uL (ref 4.0–10.5)
WBC: 9.9 10*3/uL (ref 4.0–10.5)

## 2010-05-04 LAB — COMPREHENSIVE METABOLIC PANEL
ALT: 13 U/L (ref 0–53)
ALT: 14 U/L (ref 0–53)
ALT: 16 U/L (ref 0–53)
AST: 15 U/L (ref 0–37)
AST: 20 U/L (ref 0–37)
AST: 21 U/L (ref 0–37)
AST: 22 U/L (ref 0–37)
AST: 23 U/L (ref 0–37)
AST: 24 U/L (ref 0–37)
AST: 25 U/L (ref 0–37)
Albumin: 2 g/dL — ABNORMAL LOW (ref 3.5–5.2)
Albumin: 2.1 g/dL — ABNORMAL LOW (ref 3.5–5.2)
Albumin: 2.1 g/dL — ABNORMAL LOW (ref 3.5–5.2)
Albumin: 2.3 g/dL — ABNORMAL LOW (ref 3.5–5.2)
Albumin: 2.3 g/dL — ABNORMAL LOW (ref 3.5–5.2)
Albumin: 2.4 g/dL — ABNORMAL LOW (ref 3.5–5.2)
Alkaline Phosphatase: 51 U/L (ref 39–117)
Alkaline Phosphatase: 51 U/L (ref 39–117)
Alkaline Phosphatase: 56 U/L (ref 39–117)
Alkaline Phosphatase: 60 U/L (ref 39–117)
Alkaline Phosphatase: 60 U/L (ref 39–117)
BUN: 11 mg/dL (ref 6–23)
BUN: 12 mg/dL (ref 6–23)
BUN: 26 mg/dL — ABNORMAL HIGH (ref 6–23)
BUN: 6 mg/dL (ref 6–23)
BUN: 7 mg/dL (ref 6–23)
BUN: 8 mg/dL (ref 6–23)
CO2: 26 mEq/L (ref 19–32)
CO2: 26 mEq/L (ref 19–32)
CO2: 27 mEq/L (ref 19–32)
CO2: 28 mEq/L (ref 19–32)
Calcium: 8.4 mg/dL (ref 8.4–10.5)
Calcium: 8.4 mg/dL (ref 8.4–10.5)
Calcium: 8.7 mg/dL (ref 8.4–10.5)
Calcium: 8.8 mg/dL (ref 8.4–10.5)
Chloride: 102 mEq/L (ref 96–112)
Chloride: 104 mEq/L (ref 96–112)
Chloride: 107 mEq/L (ref 96–112)
Chloride: 112 mEq/L (ref 96–112)
Creatinine, Ser: 0.75 mg/dL (ref 0.4–1.5)
Creatinine, Ser: 0.76 mg/dL (ref 0.4–1.5)
Creatinine, Ser: 0.83 mg/dL (ref 0.4–1.5)
Creatinine, Ser: 0.89 mg/dL (ref 0.4–1.5)
Creatinine, Ser: 1.19 mg/dL (ref 0.4–1.5)
Creatinine, Ser: 1.24 mg/dL (ref 0.4–1.5)
GFR calc Af Amer: >60 mL/min (ref >60)
GFR calc Af Amer: >60 mL/min (ref >60)
GFR calc Af Amer: >60 mL/min (ref >60)
GFR calc Af Amer: >60 mL/min (ref >60)
GFR calc Af Amer: >60 mL/min (ref >60)
GFR calc Af Amer: >60 mL/min (ref >60)
GFR calc non Af Amer: 57 mL/min — ABNORMAL LOW (ref >60)
GFR calc non Af Amer: 59 mL/min — ABNORMAL LOW (ref >60)
GFR calc non Af Amer: >60 mL/min (ref >60)
GFR calc non Af Amer: >60 mL/min (ref >60)
GFR calc non Af Amer: >60 mL/min (ref >60)
GFR calc non Af Amer: >60 mL/min (ref >60)
Glucose, Bld: 100 mg/dL — ABNORMAL HIGH (ref 70–99)
Glucose, Bld: 102 mg/dL — ABNORMAL HIGH (ref 70–99)
Glucose, Bld: 115 mg/dL — ABNORMAL HIGH (ref 70–99)
Potassium: 3.1 mEq/L — ABNORMAL LOW (ref 3.5–5.1)
Potassium: 3.5 mEq/L (ref 3.5–5.1)
Potassium: 3.5 mEq/L (ref 3.5–5.1)
Potassium: 4.2 mEq/L (ref 3.5–5.1)
Potassium: 4.3 mEq/L (ref 3.5–5.1)
Sodium: 140 mEq/L (ref 135–145)
Sodium: 142 mEq/L (ref 135–145)
Total Bilirubin: 0.3 mg/dL (ref 0.3–1.2)
Total Bilirubin: 0.5 mg/dL (ref 0.3–1.2)
Total Bilirubin: 0.5 mg/dL (ref 0.3–1.2)
Total Bilirubin: 0.5 mg/dL (ref 0.3–1.2)
Total Bilirubin: 0.8 mg/dL (ref 0.3–1.2)
Total Bilirubin: 0.8 mg/dL (ref 0.3–1.2)
Total Protein: 5.8 g/dL — ABNORMAL LOW (ref 6.0–8.3)
Total Protein: 6 g/dL (ref 6.0–8.3)
Total Protein: 6 g/dL (ref 6.0–8.3)
Total Protein: 6.1 g/dL (ref 6.0–8.3)

## 2010-05-04 LAB — PROTIME-INR
INR: 1.1 (ref 0.00–1.49)
Prothrombin Time: 14.1 seconds (ref 11.6–15.2)
Prothrombin Time: 15.3 seconds — ABNORMAL HIGH (ref 11.6–15.2)

## 2010-05-04 LAB — IRON AND TIBC
Iron: 13 ug/dL — ABNORMAL LOW (ref 42–135)
Saturation Ratios: 7 % — ABNORMAL LOW (ref 20–55)
TIBC: 174 ug/dL — ABNORMAL LOW (ref 215–435)

## 2010-05-04 LAB — CROSSMATCH
ABO/RH(D): O NEG
Antibody Screen: NEGATIVE

## 2010-05-04 LAB — DIFFERENTIAL
Basophils Absolute: 0 10*3/uL (ref 0.0–0.1)
Basophils Absolute: 0 10*3/uL (ref 0.0–0.1)
Basophils Absolute: 0 10*3/uL (ref 0.0–0.1)
Basophils Absolute: 0 10*3/uL (ref 0.0–0.1)
Basophils Absolute: 0 10*3/uL (ref 0.0–0.1)
Basophils Relative: 0 % (ref 0–1)
Basophils Relative: 1 % (ref 0–1)
Basophils Relative: 1 % (ref 0–1)
Eosinophils Absolute: 0.2 10*3/uL (ref 0.0–0.7)
Eosinophils Relative: 1 % (ref 0–5)
Eosinophils Relative: 3 % (ref 0–5)
Eosinophils Relative: 4 % (ref 0–5)
Eosinophils Relative: 5 % (ref 0–5)
Lymphocytes Relative: 13 % (ref 12–46)
Lymphocytes Relative: 16 % (ref 12–46)
Lymphocytes Relative: 16 % (ref 12–46)
Lymphocytes Relative: 18 % (ref 12–46)
Lymphocytes Relative: 18 % (ref 12–46)
Lymphocytes Relative: 19 % (ref 12–46)
Lymphs Abs: 1.1 10*3/uL (ref 0.7–4.0)
Lymphs Abs: 1.2 10*3/uL (ref 0.7–4.0)
Lymphs Abs: 1.3 10*3/uL (ref 0.7–4.0)
Lymphs Abs: 1.4 10*3/uL (ref 0.7–4.0)
Monocytes Absolute: 0.5 10*3/uL (ref 0.1–1.0)
Monocytes Absolute: 0.7 10*3/uL (ref 0.1–1.0)
Monocytes Absolute: 0.7 10*3/uL (ref 0.1–1.0)
Monocytes Absolute: 1.1 10*3/uL — ABNORMAL HIGH (ref 0.1–1.0)
Monocytes Relative: 10 % (ref 3–12)
Monocytes Relative: 10 % (ref 3–12)
Monocytes Relative: 7 % (ref 3–12)
Monocytes Relative: 8 % (ref 3–12)
Neutro Abs: 4.4 10*3/uL (ref 1.7–7.7)
Neutro Abs: 4.8 10*3/uL (ref 1.7–7.7)
Neutro Abs: 4.9 10*3/uL (ref 1.7–7.7)
Neutro Abs: 5.3 10*3/uL (ref 1.7–7.7)
Neutro Abs: 5.6 10*3/uL (ref 1.7–7.7)
Neutro Abs: 6 10*3/uL (ref 1.7–7.7)
Neutrophils Relative %: 69 % (ref 43–77)
Neutrophils Relative %: 70 % (ref 43–77)
Neutrophils Relative %: 72 % (ref 43–77)

## 2010-05-04 LAB — BRAIN NATRIURETIC PEPTIDE
Pro B Natriuretic peptide (BNP): 222 pg/mL — ABNORMAL HIGH (ref 0.0–100.0)
Pro B Natriuretic peptide (BNP): 276 pg/mL — ABNORMAL HIGH (ref 0.0–100.0)
Pro B Natriuretic peptide (BNP): 320 pg/mL — ABNORMAL HIGH (ref 0.0–100.0)
Pro B Natriuretic peptide (BNP): 345 pg/mL — ABNORMAL HIGH (ref 0.0–100.0)
Pro B Natriuretic peptide (BNP): 376 pg/mL — ABNORMAL HIGH (ref 0.0–100.0)

## 2010-05-04 LAB — BLOOD GAS, ARTERIAL
Acid-base deficit: 0.8 mmol/L (ref 0.0–2.0)
Bicarbonate: 23.2 mEq/L (ref 20.0–24.0)
FIO2: 0.21 %
O2 Saturation: 91.6 %
Patient temperature: 98.3
TCO2: 24.3 mmol/L (ref 0–100)
pCO2 arterial: 36.4 mmHg (ref 35.0–45.0)
pH, Arterial: 7.419 (ref 7.350–7.450)
pO2, Arterial: 58.9 mmHg — ABNORMAL LOW (ref 80.0–100.0)

## 2010-05-04 LAB — HEMOGLOBIN A1C
Hgb A1c MFr Bld: 5.9 % (ref 4.6–6.1)
Mean Plasma Glucose: 123 mg/dL

## 2010-05-04 LAB — URINALYSIS, ROUTINE W REFLEX MICROSCOPIC
Glucose, UA: NEGATIVE mg/dL
Hgb urine dipstick: NEGATIVE
Ketones, ur: 15 mg/dL — AB
Protein, ur: 30 mg/dL — AB

## 2010-05-04 LAB — AMYLASE: Amylase: 18 U/L — ABNORMAL LOW (ref 27–131)

## 2010-05-04 LAB — URINE MICROSCOPIC-ADD ON

## 2010-05-04 LAB — POCT CARDIAC MARKERS: Troponin i, poc: <0.05 ng/mL (ref 0.00–0.09)

## 2010-05-04 LAB — GLUCOSE, CAPILLARY: Glucose-Capillary: 108 mg/dL — ABNORMAL HIGH (ref 70–99)

## 2010-05-04 LAB — CEA: CEA: 2.9 ng/mL (ref 0.0–5.0)

## 2010-05-04 LAB — PSA: PSA: 2.55 ng/mL (ref 0.10–4.00)

## 2010-05-04 LAB — URINE CULTURE: Colony Count: NO GROWTH

## 2010-05-04 LAB — APTT: aPTT: 34 seconds (ref 24–37)

## 2010-05-04 LAB — ABO/RH: ABO/RH(D): O NEG

## 2010-06-08 NOTE — H&P (Signed)
NAME:  Jermaine, Wright NO.:  [id number]   MEDICAL RECORD NO.:  [id number]          PATIENT TYPE:  INP   LOCATION:  2921                         FACILITY:  [facility]   PHYSICIAN:  Gaspar Garbe, M.D.DATE OF BIRTH:  1928/05/09   DATE OF ADMISSION:  06/09/2008  DATE OF DISCHARGE:                              HISTORY & PHYSICAL   CHIEF COMPLAINT:  Progressive shortness of breath.   HISTORY OF PRESENT ILLNESS:  The patient is a 77 year old white male,  well known to me as he is a former neighbor I have known ever since I  have arrived in [place] almost 8 years ago.  He usually comes in for  physical by once a year and has some respiratory difficulties in the  springtime consistent with allergies.  She is very phobic of  hospitalization, as he had a parent passed away in the hospital and has  been very resistant to any inpatient workups or even outpatient workups  not involving my office until this time.  He was brought to the  emergency room after having progressive shortness of breath over the  course of the weekend.  Of note, this has been followed for  approximately the past 2 months which began with an episode of shingles  for which he received treatment with Lyrica, which seemed to make him  somewhat confused as well as Vicodin, as he had resultant postherpetic  neuralgia and also had intervening breathing exacerbations and some  elevation of edema for which he had an outpatient BNP in the 300s.  It  has been very difficult to get [name] to agree to other workup  outside of his office visits and hospitalization was actually offered at  his last office visit given his progression of symptoms and this was  refused as well.  Over the course of this weekend, his shortness of  breath has gotten worse.  He has not slept well in 3 days.  He was  finally taken to the emergency room where she was evaluated.  He had a D-  dimer evaluation, which showed an  elevation, however, subsequent  pulmonary angiogram by CAT scan was inconclusive due to movement  artifacts, and therefore, provides no useful data.  He was subsequently  had a lower extremity Doppler at my request, which was negative.  His  chest x-ray also shows an elevated right hemidiaphragm, which is chronic  for him and some atelectasis on the left, but is otherwise unremarkable  for any persistent change.  He was found to have a sat 97% on room air  but being dyspneic.  I requested that the emergency room order arterial  blood gas.  This was being performed just I was seeing the patient after  the end of my office day 2 hours after I was called.  I was met in the  emergency room by the patient's daughter, Kathy Breach and his girlfriend, Carney Bern  who usually accompanies him to his visits.  They provided the majority  of the history recently, as [name] is trying to minimize his  symptoms in  an effort not to stay in the hospital.   ALLERGIES:  No known drug allergies.   MEDICATIONS:  Advair 250/50 one puff twice daily, albuterol as inhaler.  The patient's prior medications recently have involved Cymbalta, Lyrica,  and Vicodin for nerve pain and the baby aspirin, which he is not  currently taking.  He has also been set up with home health to aid for  physical therapy, occupational therapy, and home health, RN from his  last office visit.   PAST MEDICAL HISTORY:  1. COPD with an asthma component.  He seems to be fairly responsive to      beta agonist and has refused any outpatient testing for pulmonary      function tests in the past.  2. He has a considerable smoking history and asbestos exposure as      well.  3. Persistent right elevated hemidiaphragm, this was noted on his      first physical in my office, greater than 4 years ago and remains      unchanged.  4. Osteoarthritis, mostly of knees and hips as well as hands.  5. The patient has had BPH with elevated PSA.  Elevations of  PSA have      been responsive to antibiotics thus far, as he refuses further      workup.   PAST SURGICAL HISTORY:  The patient had a sebaceous cyst removed.   SOCIAL HISTORY:  The [place] the [place] near [place].  He is  with his living girlfriend, Carney Bern and he is a former Radio broadcast assistant.  He  is widowed with 3 children.  He has a 20-pack-year smoking history but  has not smoked in approximately the past 15.  Up until this year, he had  a fairly consistent alcohol intake, but Carney Bern indicates that he has not  had considerable drinking since last summer and not having any illicit  drug usage.   FAMILY HISTORY:  Mother died at age 16 of a heart attack.  Father died  at age 68 following a surgery.   REVIEW OF SYSTEMS:  The patient complains mostly of shortness of breath,  dyspnea on exertion, and swelling in his legs as well as pain in his  left upper quadrant of his abdomen/left rib margin with deep  inspiration.  The patient denies any fevers, chills, or sweats, but  family has noticed a decreased appetite and difficulty with breathing  mostly in a sitting position.  He actually breathes better with lying  flat.   ADVANCED DIRECTIVES:  The patient is a full code.   PHYSICAL EXAMINATION:  VITAL SIGNS:  Temperature 97.6, pulse 105,  respiratory rate 22, blood pressure 118/72, sating 94% on 2 L.  GENERAL:  The patient appears to be somewhat tired.  He is, however,  conversant and mentally clear.  HEENT:  Normocephalic, atraumatic.  No JVD is appreciated.  TMs are,  otherwise, clear.  NECK:  Supple.  No lymphadenopathy noted.  No thyromegaly.  HEART:  Regular rate and rhythm, although premature beats are seen on  EKG.  Does not having them to palpation of his wrist.  His pulses are 2+  and equal bilaterally.  LUNGS:  The patient has delayed expiration and wheezes considerable on  his left side more so than the right base, may be diminished on the  right due to his chronic  elevated right hemidiaphragm.  SKIN:  The patient has no rashes or lesions.  ABDOMEN:  His abdomen is distended.  He normally has a fairly bloated  abdomen, although it does seem somewhat more firm at this point.  There  is no organomegaly present.  EXTREMITIES:  The patient has 2+ lower extremity edema with some pitting  to about knee level.  This was nontender and may be minimized by his  legs being elevated for most of the day.  MUSCULOSKELETAL:  The patient has no joint deformities or spine or CVA  tenderness.  NEURO:  The patient is oriented to person, place, and time; however, he  becomes somewhat somnolent unless he is directly confronted.  He has 5/5  strength in all extremities and no other neurologic deficits noted.  Of  note, family has noted episodic confusion.  He does not appear to be  having at this time.   Chest x-ray shows chronic elevated right hemidiaphragm with a question  of atelectasis versus infiltrate on left base per radiologist, this  appears at his baseline from his most recent chest x-ray in the office  from 1 month ago.  CT angiogram is not conclusive secondary to movement  artifact.  It was suggested that this be repeated as Radiology does not  have technetium to perform an adequate V/Q scan.  EKG showed some PVCs  but nothing consistent with a strain pattern or any acuity.   LABORATORY DATA:  White count 6.9, hemoglobin 12.1, hematocrit 35.9,  platelets 313.  BUN and creatinine 12 and 0.8 respectively, glucose  normal at 87, sodium 140, potassium 4.1.  Myoglobin elevated at 243, MB  is 1.2, troponin 0.04.  The patient's BNP is elevated at 383.  D-dimer  is elevated at 4.9.  Blood gas shows a pH of 7.43 with PCO2 of 29, and  O2 of 64 and sating 96% on 2 L.   ASSESSMENT AND PLAN:  1. Dyspnea on exertion.  This is most likely the combination of      chronic lung disease for him.  He states that he is feeling long-      acting beta agonist and inhaled  steroid combination new and      multiple problems, but his edema may in fact be related to      secondary pulmonary hypertension and right heart failure as well.      The patient has been ordered for 40 mg of IV Lasix in the emergency      room, this hopefully will help some of this.  He will undergo an      echocardiogram in the morning as well.  In addition, we will try to      get PFTs with DLCO.  As the patient most likely has a mixed picture      of obstructive and restrictive disease given his body habitus, his      extended abdomen, and also possible asbestos exposure, he is quite      a mixed bag of possible causes for his shortness of breath.      Unfortunately, pulmonary embolus could not be completely ruled out      for a patient who has had chronic illness and recent bout of      shingles with inflammation and expected D-dimer to be high and      nondiagnostic.  Fortunately, the lower extremity Dopplers were      negative; however, we clearly have now ruled out pulmonary embolus.      At this time, I would prefer the patient  to undergo a V/Q scan, but      technetium cannot be arranged for tomorrow per [facility], may      have to perform another CT angiogram which hopefully he will be      still for and we can get better pictures, so I would like the      patient not to undergo arteriography simply because with means for      performing a proper tester not available in the hospital.  2. Lower extremity edema.  Lasix as above.  We will further look into      cardiac difficulties with echocardiogram.  3. Altered mental status.  Initially, I thought the patient might be      having some CO2 retention, but this may be simply oxygen      deprivation and inability to take an adequate breath.  He will be      maintained on oxygen.  I suspect he may require oxygen at home,      long term if this does not improve acutely.  4. I have placed the patient in a Stepdown Unit and asked  Dr. Sherene Sires of      Pulmonary to see the patient, as I am somewhat concerned about his      effort of breathing.  Hopefully, we will be able to improve his      pulmonary status in the next 24-48 hours by receiving some actual      objective data on his pulmonary and cardiac function.      Gaspar Garbe, M.D.  Electronically Signed     RWT/MEDQ  D:  06/09/2008  T:  06/10/2008  Job:  [id number]   cc:   Charlaine Dalton. Sherene Sires, MD, FCCP

## 2010-06-08 NOTE — Op Note (Signed)
NAME:  Jermaine Wright, Jermaine Wright NO.:  [id number]   MEDICAL RECORD NO.:  [id number]          PATIENT TYPE:  INP   LOCATION:  2306                         FACILITY:  [facility]   PHYSICIAN:  Kerin Perna, M.D.  DATE OF BIRTH:  Dec 11, 1928   DATE OF PROCEDURE:  06/25/2008  DATE OF DISCHARGE:                               OPERATIVE REPORT   OPERATIONS:  1. Coronary artery bypass grafting x4 (left internal mammary artery to      left anterior descending, saphenous vein graft to posterior      descending branch of the dominant circumflex, sequential saphenous      vein graft to ramus intermediate and circumflex marginal).  2. Endoscopic harvest of right leg greater saphenous vein.   SURGEON:  Kerin Perna, MD   ASSISTANT:  Gershon Crane, PA-C   PREOPERATIVE DIAGNOSIS:  Class IV unstable angina with subendocardial  myocardial infarction.   POSTOPERATIVE DIAGNOSIS:  Class IV unstable angina with subendocardial  myocardial infarction.   CHIEF COMPLAINT:  Shortness of breath and chest pain.   INDICATIONS:  The patient is a 77 year old Caucasian male ex-smoker with  a history of hypertension who presented with progressive shortness of  breath, weakness, and evidence of congestive heart failure.  Cardiac  enzymes were mildly elevated and a cardiology consultation was obtained.  A 2-D echo showed some atrial wall hypokinesia and evidence of CHF on  his x-ray.  After he was stabilized, he underwent cardiac  catheterization by Dr. Deborah Chalk which demonstrated severe multivessel  coronary disease with a 90% stenosis of the LAD, 80-90% stenosis of a  proximal dominant circumflex with distal disease into the posterolateral  and posterior descending branches and 80% stenosis of the ramus  intermediate branch of the left coronary.  The right coronary was small  and nondominant.  His EF was approximately 45% and a transesophageal  echo showed no evidence of significant valvular  abnormality.  After the  patient's overall condition improved, it was felt he was a candidate for  surgical revascularization.   Prior to surgery, I reviewed the patient's situation and his cardiac  cath with the patient and the family.  I discussed indications and  expected benefits of multivessel coronary artery bypass surgery for  treatment of his severe three-vessel coronary artery disease.  I  reviewed the alternatives to surgical therapy.  I discussed with him the  major details of surgery including the use of general anesthesia and  cardiopulmonary bypass, the location of the surgical incisions, and the  expected postoperative hospital recovery.  I reviewed with the patient  and family the risks including risks of MI, CVA, bleeding, blood  transfusion requirement, infection, and death.  He understood with  baseline preoperative anemia and would probably require blood  transfusion therapy.  Also with the history of significant COPD,  chronically elevated right hemidiaphragm, and history of alcohol abuse  with evidence of brain atrophy on his CT of head that he would be at  increased risk for postoperative altered mental status or delirium.  After reviewing these issues, both he and the  family demonstrated their  understanding and agreed to proceed with surgery under what I felt was  an informed consent.   OPERATIVE FINDINGS:  1. Severe multivessel coronary artery disease, corrected with surgical      revascularization.  2. Good conduits with excellent flow through the mammary artery and      small but adequate conduit from the right leg saphenous vein.  3. Intraoperative anemia with a hemoglobin of 6.8 requiring 2 units of      packed cell transfusion.   PROCEDURE IN DETAIL:  The patient was brought to the operating room and  placed supine on the operating table where general anesthesia was  induced.  The chest, abdomen, and legs were prepped with Betadine and  draped as a  sterile field.  A transesophageal 2-D echo probe was placed  by the anesthesiologist which confirmed the preoperative diagnosis of  mild-to-moderate reduction in LV function without significant valvular  disease.  A sternal incision was made and the saphenous vein was  harvested endoscopically from the right leg.  The left internal mammary  artery was then exposed and harvested as a pedicle graft from its origin  at the subclavian vessels.  It had excellent flow.  A sternal retractor  was placed in the pericardium.  It was opened and suspended.  Purse-  strings were placed in the ascending aorta and right atrium and after  the vein had been harvested and inspected, heparin was administered.  The patient was cannulated and placed on bypass.  The coronaries were  identified for grafting in the mammary artery and vein grafts were  prepared for the distal anastomoses.  Cardioplegia catheter was placed  for both antegrade and retrograde cold blood cardioplegia and the  patient was cooled to 32 degrees.  The aortic crossclamp was applied and  800 mL of cold blood cardioplegia was delivered in split doses between  the antegrade aortic and retrograde coronary sinus catheters.  There was  a good cardioplegic arrest and septal temperature dropped less than 14  degrees.  Cardioplegia was delivered every 20 minutes or less while the  crossclamp was in place.   The distal coronary anastomoses were then performed.  The first distal  anastomosis was posterior descending branch to the distal circumflex.  This was a 1.5-mm vessel with proximal 90% stenosis.  Reverse saphenous  vein was sewn end-to-side with running 7-0 Prolene with good flow  through the graft.  The second distal anastomosis was a sequential vein  graft.  The first limb of the sequential vein graft was a side-to-side  anastomosis to the large ramus intermediate which was a 1.75-mm vessel  with proximal 80% stenosis.  The anastomosis was  constructed with a  running 7-0 Prolene between the reverse saphenous vein and the ramus  intermediate.  A third distal anastomosis was a continuation of the  sequential vein graft to the distal circumflex marginal.  This was a 1.5-  mm vessel with a proximal 80-90% stenosis and the end of the vein was  sewn end-to-side with running 7-0 Prolene with good flow through graft.  Cardioplegia was redosed.  The fourth distal anastomosis was to the  distal third of the LAD.  It was a 1.4-mm vessel with a proximal 90-95%  stenosis.  The left IMA pedicle was brought through an opening created  in the left lateral pericardium.  It was brought down onto the LAD and  sewn end-to-side with running 8-0 Prolene.  There was a  good flow  through the anastomosis after briefly releasing the pedicle bulldog on  the mammary artery.  The bulldog was reapplied and the pedicle was  secured to the epicardium.  Cardioplegia was redosed.   While the crossclamp was still in place, two proximal vein anastomoses  were performed on the ascending aorta.  The aorta was somewhat friable  and sutures were difficult to place without tearing the tissue.  However, after careful construction of the anastomosis, the crossclamp  was removed after venting air and there was good flow through the vein  grafts and bleeding was eliminated.  Also, a dose of retrograde warm  blood cardioplegia was given prior to release of the crossclamp to  remove any residual intracoronary air.   The heart was cardioverted back to a regular rhythm and the vein grafts  were opened.  Each had good flow and hemostasis was documented at the  proximal and distal sites.  The temporary pacing wires were applied and  the patient was rewarmed and reperfused.  When the patient reached 37  degrees, the lungs re-expanded and the ventilator was resumed.  The  patient was then weaned off bypass on renal dose dopamine with stable  blood pressure and good cardiac  output.  The transesophageal echo showed  preserved preservation of LV function with EF of 45-50%.  Protamine was  administered without adverse reaction.  The cannula was removed.  The  mediastinum was irrigated with warm antibiotic irrigation.  The leg  incision was irrigated and closed in a standard fashion.  The superior  pericardial fat was closed over the aorta.  There was still significant  diffuse bleeding and with a history of heavy alcohol abuse, I felt that  FFP and another small dose of protamine would be beneficial.  The  patient also received a platelet transfusion.  With component therapy  and extra protamine, there was improvement in coagulation function.  Sternal wires were placed and the sternum was closed.  The pectoralis  fascia was closed in running #1 Vicryl.  Subcutaneous and skin layers  were closed in running Vicryl and sterile dressings were applied.   TOTAL BYPASS TIME:  152 minutes.      Kerin Perna, M.D.  Electronically Signed     PV/MEDQ  D:  06/25/2008  T:  06/26/2008  Job:  [id number]   cc:   Colleen Can. Deborah Chalk, M.D.

## 2010-06-08 NOTE — Consult Note (Signed)
NAMEMarland Wright  Wright, Jermaine NO.:  [id number]   MEDICAL RECORD NO.:  [id number]          PATIENT TYPE:  INP   LOCATION:  2032                         FACILITY:  [facility]   PHYSICIAN:  Kerin Perna, M.D.  DATE OF BIRTH:  September 20, 1928   DATE OF CONSULTATION:  06/18/2008  DATE OF DISCHARGE:                                 CONSULTATION   REQUESTING PHYSICIAN:  Colleen Can. Deborah Chalk, MD   REASON FOR CONSULTATION:  Severe multivessel coronary artery disease  with moderate reduction in LV function.   HISTORY OF PRESENT ILLNESS:  I was asked to evaluate this 77 year old  chronically-ill Caucasian male, ex-smoker, with history of alcohol abuse  who was admitted to the hospital earlier this month to the medical  service with progressive shortness of breath.  The patient's CT  angiogram and Doppler studies were negative for DVT or pulmonary emboli.  He has a history of smoking, and his chest x-ray showed evidence of COPD  with a elevated right hemidiaphragm, a chronic problem.  The patient has  been fully evaluated by the Pulmonary service and PFTs were performed  which showed moderate obstructive changes with FEV-1 of 1.2 and FVC of  1.5.  His pO2 on room air is 58 and his pCO2 was normal.  He next  underwent a careful Cardiology evaluation by Dr. Deborah Chalk.  It was noted  that his BNP level was high at 300-400.  A 2-D echo showed anterior  hypokinesia and was felt he had evidence of heart failure.  Because of  the high likelihood of ischemic etiology of his heart failure in  addition to his alcohol abuse, he underwent a left heart cath.  This  demonstrated left dominant coronary circulation with high-grade 90%  stenosis of the LAD with TIMI II flow.  The circumflex had high-grade 80-  90% stenosis proximally and distally and the ramus intermediate branch  of the left coronary also had an 80% stenosis.  His EF was approximately  40%.  His left ventricular end-diastolic pressure was  moderately  elevated.  He was also noted to have a small infrarenal abdominal  aneurysm approximately 3 cm.  Because of his multivessel coronary artery  disease and symptoms of CHF and basic failure to thrive, a thoracic  surgical evaluation was requested.   PAST MEDICAL HISTORY:  1. Tobacco abuse and COPD, no smoking for 20 years.  2. Facial herpes zoster with neuralgia, February 2010.  3. Chronically elevated right hemidiaphragm and COPD.  4. Osteoarthritis.  5. BPH with elevated PSA of 2.3.  6. Heavy alcohol abuse but stopped approximately 1 year ago.  7. Gradual debility, deconditioning, and weight loss of 20-30 pounds.   HOME MEDICATIONS:  Lyrica, Cymbalta, Vicodin, albuterol, and Advair  Diskus.   ALLERGIES:  None.   FAMILY HISTORY:  Positive for MI and abdominal aortic aneurysm in his  father.   SOCIAL HISTORY:  The patient is widowed and has 3 children.  Lives with  his girlfriend.  He does not drink or use alcohol currently.  He has  decline in his general health  and strength over the past 3 months.   REVIEW OF SYSTEMS:  Positive for weight loss 20-25 pounds.  Negative for  fever or night sweats.  He denies any dental problems or difficulty  swallowing.  A GI evaluation including a CT scan of the abdomen has been  basically negative with a normal CEA level and normal LFTs.  His amylase  is normal.  He has an appearance of an ileus on his previous x-rays  during this hospitalization, but he is having bowel movements and there  is no evidence of C. diff colitis.  Endocrine review is negative for  diabetes.  Vascular review is negative for DVT by his recent ultrasound  and carotid Doppler study is pending.  He does have evidence of some  confusion and dementia and a head CT scan is pending.   PHYSICAL EXAMINATION:  GENERAL:  He is a chronically-ill appearing  elderly white male in his hospital room, in no acute distress but  somewhat agitated and confused.  VITAL SIGNS:   Blood pressure is 102/60, pulse 91 sinus, saturation on  room air 93%, and temperature 98.4.  HEENT:  Normocephalic.  Pupils are reactive.  NECK:  Without JVD or bruit.  LYMPHATICS:  No palpable supraclavicular or cervical adenopathy.  CARDIAC:  Regular with few PACs.  No murmur or gallop is appreciated.  LUNGS:  Diminished with scattered rhonchi.  Diminished breath sounds at  the right base.  ABDOMEN:  Dilated, tympanitic, and nontender.  No masses.  No pulsatile  mass.  EXTREMITIES:  Pedal edema 1 to 2+, but no tenderness.  Mild clubbing,  but no cyanosis.  Pulses are not palpable below the groin.  NEUROLOGIC:  Somewhat confused, but he is able walk with a rolling walker out in the  hallway with myself and shows no focal motor deficit.   LABORATORY DATA:  I reviewed his multiple labs and studies.  He  basically has severe 3-vessel coronary artery disease with moderate  reduction in LV function (EF 40%).  There is no evidence of valvular  disease, although a TEE is being planned as to further evaluate his  cardiac valves since the transthoracic study was of poor quality from  his COPD.  His nutritional status is very poor with a prealbumin of 5.6  and his weakness is a significant risk factor for surgery (fragility  index).  However, with his severe coronary artery disease, his outlook  is poor, and surgical revascularization of his coronary artery disease  will improve survival.  We will work on improving his nutritional status  and overall strength and physical therapy and tentatively schedule  surgery for middle of next week.  This was discussed with the patient  and girlfriend and they understand and agree.      Kerin Perna, M.D.  Electronically Signed     PV/MEDQ  D:  06/20/2008  T:  06/20/2008  Job:  [id number]

## 2010-06-08 NOTE — Discharge Summary (Signed)
NAME:  Jermaine Wright, Jermaine Wright NO.:  [id number]   MEDICAL RECORD NO.:  [id number]          PATIENT TYPE:  INP   LOCATION:  2016                         FACILITY:  [facility]   PHYSICIAN:  Kerin Perna, M.D.  DATE OF BIRTH:  09-Feb-1928   DATE OF ADMISSION:  06/09/2008  DATE OF DISCHARGE:  07/01/2008                               DISCHARGE SUMMARY   ADMITTING DIAGNOSES:  1. Multivessel coronary artery disease (with an ejection fraction of      30-35%).  2. History of tobacco abuse, quit approximately 15-20 years ago.  3. History of chronic obstructive pulmonary disease and chronically      elevated hemidiaphragm.  4. History of alcohol abuse (quit approximately 1 year ago).  5. History of benign prostatic hypertrophy.   DISCHARGE DIAGNOSES:  1. Multivessel coronary artery disease (with an ejection fraction of      30-35%).  2. History of tobacco abuse, quit approximately 15-20 years ago.  3. History of chronic obstructive pulmonary disease and chronically      elevated hemidiaphragm.  4. History of alcohol abuse (quit approximately 1 year ago).  5. History of benign prostatic hypertrophy.  6. Postoperative paroxysmal atrial fibrillation.  7. Postoperative blood loss anemia.   PROCEDURES:  1. Echocardiogram performed on Jun 10, 2008.  2. Cardiac catheterization performed by Dr. Deborah Chalk on Jun 12, 2008.  3. Transesophageal echocardiogram on Jun 20, 2008.  4. Coronary artery bypass grafting x4 (left internal mammary artery to      left anterior descending, saphenous vein graft to posterior      descending branch and the dominant circumflex, sequential saphenous      vein graft to ramus intermedius circumflex marginal with endoscopic      vein harvesting of the right leg by Dr. Donata Clay on June 25, 2008.   HISTORY OF PRESENT ILLNESS:  This is a 77 year old male with past  medical history of COPD, tobacco, and alcohol abuse, who was admitted to  [name] on Jun 09, 2008, with progressive shortness of breath.  CT  angiogram and Doppler studies were negative for DVT or pulmonary emboli.  Chest x-ray did show evidence of COPD with an elevated right  hemidiaphragm (the patient with a history of chronically elevated right  hemidiaphragm).  The patient was evaluated by pulmonologist and PFTs  were performed.  FEV-1 was 1.2 and FVC 1.5.  He was then seen by Dr.  Deborah Chalk from Cardiology.  He was found to have a BNP elevated between  300 and 479.  A 2-D echo showed anterior hypokinesis as well as evidence  of heart failure.  The patient then underwent a cardiac catheterization  on Jun 17, 2008, which showed a high-grade 90% stenosis of the LAD,  circumflex with 80-90% stenosis proximally and distally, and the ramus  intermediate branch had an 80% stenosis.  EF was approximately 40%.  Incidentally, he was also found to have a small infrarenal abdominal  aneurysm approximately 3 cm.  A cardiothoracic consultation was obtained  with Dr. Donata Clay.  It should be noted that the  patient had complaints  of weight loss and decreased strength.  His albumin was found to be 2.  He was found to have severe ileus.  A GI consultation was obtained.  CT  of the abdomen and pelvis were done on Jun 19, 2008, results showed no  significant abdominal ileus, suggested thickening of the wall of the  cecum, infrarenal abdominal aortic aneurysm and possible gallstones.  The patient's GI symptoms did gradually improve and the patient was  tolerating p.o.  A CT of the head was also done on Jun 20, 2008, which  showed no acute infarct or hemorrhage.  Duplex carotid ultrasound  revealed 40-59% right internal carotid artery stenosis, but no  significant left internal carotid artery stenosis.  The patient then  underwent the aforementioned CABG x4 on June 25, 2008.   Dictation ended at this point.      Doree Fudge, [place]      Kerin Perna, M.D.  Electronically  Signed    DZ/MEDQ  D:  06/30/2008  T:  07/01/2008  Job:  [id number]

## 2010-06-08 NOTE — Consult Note (Signed)
NAMEMarland Kitchen  AADAM, ZHEN NO.:  [id number]   MEDICAL RECORD NO.:  [id number]          PATIENT TYPE:  INP   LOCATION:  2032                         FACILITY:  [facility]   PHYSICIAN:  Colleen Can. Deborah Chalk, M.D.DATE OF BIRTH:  10-26-1928   DATE OF CONSULTATION:  06/12/2008  DATE OF DISCHARGE:                                 CONSULTATION   We have been asked by Dr. Wylene Simmer to see Jermaine Wright in consultation.  He is seen for evaluation of shortness of breath and an ejection  fraction of 30% by echocardiography of questionable etiology.  He  presents to the hospital with progressive dyspnea and inability to  sleep.  He has had dyspnea for some time but has been more progressive  over the last 1-2 months.  He has had a history of COPD related to  cigarette abuse but stopped smoking in 1977.  He had herpes zoster in  February 2010 and his girlfriend is noted a significant decline and he  fell since that time.   PAST MEDICAL HISTORY:  1. Tobacco abuse and he stopped knee in 1977.  2. He has had a history of dyspnea.  3. He has had herpes zoster with postherpetic neuralgia involving his      head.  4. COPD and asthma.  5. History of asbestos exposure.  6. Chronically elevated right hemidiaphragm.  7. Osteoarthritis.  8. BPH.  9. History of elevated PSA with refusal for evaluation.  10.He did have heavy alcohol consumption up until less than 1 year      ago.   ALLERGIES:  None.   CURRENT MEDICATIONS:  Advair, albuterol, previously he was on aspirin,  Lyrica, Cymbalta, and Vicodin.   FAMILY HISTORY:  Mother died at age 7 of myocardial infarction and had  abdominal aortic aneurysm.  Father died at age 56 after surgery.   SOCIAL HISTORY:  He is widowed.  He has 3 children, lives with his  girlfriend.  He has a 20-pack-year smoking history, none last 15 years.  Prior alcohol use, he stopped last summer.   REVIEW OF SYSTEMS:  He has had decreased appetite, dyspnea on  exertion,  positive lower extremity edema.  He has had chronic left upper quadrant  abdominal pain, osteoarthritis, a 23-pound weight loss since February.  He has had no chest pain.  He had a fall yesterday, but it did not  result in any injuries.  He has been blowed and has gas.  He has not  been active.  All other review of systems are negative.   PHYSICAL EXAMINATION:  GENERAL:  He is a pleasant 77 year old male.  He  is somewhat chronically ill-appearing.  VITAL SIGNS:  Blood pressure is 130/82, heart rate is 90-100, O2 sats  are 92%.  SKIN:  Warm and dry.  Color is pale.  HEENT:  He is normocephalic, atraumatic.  Pupils equal, round, and  reactive to light. Sclera nonicteric.  NECK:  Supple.  No masses. No JVD. No lymphadenopathy.  LUNGS:  Coarse with few scattered rales.  HEART:  Tachycardia with no murmur, no gallop.  Regular rhythm.  ABDOMEN:  Distended yet soft.  Spleen is not palpable.  Liver is not  palpable.  GENITOURINARY:  Normal genitalia.  EXTREMITIES:  Trace edema bilaterally.  NEUROLOGICAL:  There are no gross deficits.  MUSCULOSKELETAL:  He is walking with a walker. ROM appears intact.  Strength appears symmetric.   PERTINENT LABORATORY DATA:  Hematocrit is 30, hemoglobin is 10,  platelets are 291,000.  Potassium is 3.1, BUN 18, creatinine is 1.0 with  albumin of 2.3.  BNP is 479.   CT angio of the chest is negative for pulmonary embolism.  A 2-D  echocardiogram showed poor study with an estimated ejection fraction of  30%.   IMPRESSION:  1. Shortness of breath, question multifactorial.  2. Left ventricular systolic failure, started on Lasix, ACE inhibitor,      and beta-blocker but questionable etiology for decreased ejection      fraction.  3. Chronic obstructive pulmonary disease.  4. History of multisubstance abuse.  5. Anemia.  6. Fever, question urinary tract.  7. Prior history of shingles.   PLAN:  We will diurese, add beta-blocker and ACE  inhibitor as blood  pressure tolerates.  We will check a urinalysis and a urine culture.  We  need to find the etiology of decreased LVEF either ischemic in nature or  possibly related to the years of alcohol abuse.  I think that will be  the crux of the issue.  It may be that TEE would provide a good bit of  that information given the poor echo window because of the COPD. Thank  you for asking [place] to see Jermaine Wright.      Colleen Can. Deborah Chalk, M.D.  Electronically Signed     SNT/MEDQ  D:  06/12/2008  T:  06/13/2008  Job:  [id number]   cc:   Gaspar Garbe, M.D.

## 2010-06-08 NOTE — Discharge Summary (Signed)
NAME:  Jermaine Wright, Jermaine Wright NO.:  [id number]   MEDICAL RECORD NO.:  [id number]          PATIENT TYPE:  INP   LOCATION:  2016                         FACILITY:  [facility]   PHYSICIAN:  Kerin Perna, M.D.  DATE OF BIRTH:  July 29, 1928   DATE OF ADMISSION:  06/09/2008  DATE OF DISCHARGE:                               DISCHARGE SUMMARY   ADDENDUM:   ADMITTING DIAGNOSIS:  Anemia (likely anemia of chronic disease).   DISCHARGE DIAGNOSIS:  Anemia (likely anemia of chronic disease) and  please remove the postoperative anemia from discharge diagnoses only.   BRIEF HOSPITAL COURSE STAY:  The patient was afebrile and  hemodynamically stable postoperatively.  A-line Swan and chest tubes  were removed early in the postoperative course.  The patient did have  paroxysmal atrial fibrillation and was started on p.o. amiodarone.  He  was also volume overloaded and diuresed accordingly.  He did develop  some hypotension to Lopressor, which had been initiated postoperatively  was held.  Gradually, blood pressure did improve.  The patient was then  transferred from the intensive care unit to [facility] for further  convalescence.  The patient was slowly progressing with cardiac rehab  and currently on June 30, 2008, the patient is afebrile, vital signs are  stable, O2 sat is 99% on 2 L nasal cannula.  Preop weight is 82 kg.  Today's weight 86 kg.   PHYSICAL EXAMINATION:  CARDIOVASCULAR:  Regular rate and rhythm.  LUNGS:  Diminished at the right base.  ABDOMEN:  Benign.  EXTREMITIES:  Minimal edema of the right lower extremity.  SKIN:  Sternal and lower extremity wounds are clean, dry, and continuing  to heal.   The patient is already tolerating a diet and has had a bowel movement.  Chest tube sutures and epicardial pacer wires will be removed prior to  discharge.  The patient remains afebrile, hemodynamically stable.  He is  weaned off O2.  He will be discharged home with home health on July 01, 2008.   LATEST LABORATORY STUDIES:  BMET done on June 29, 2008, potassium 3.6,  which has already been supplemented.  BUN and creatinine 7 and 0.7  respectively.  CBC, H and H 9.3 and 27.8, white count is 8500, platelet  count 218,000.  Last chest x-ray done on June 29, 2008, showed improved  aeration of the left lower lobe.  No pneumothorax.   DISCHARGE INSTRUCTIONS:  As follows.  The patient is to remain on a low-  fat, low-salt diet.  He is not to drive or lift more than 10 pounds.  He  is to extend his breathing exercise daily.  He is to walk every day and  increase routine duration as tolerates.  He may shower.  He is to  cleanse his wounds with mild soap and water.  He is to call the office  if any wound problems arise.   FOLLOWUP APPOINTMENTS:  1. The patient is to contact Dr. Ronnald Nian office for followup      appointment in 2 weeks.  2. The patient has an  appointment to see Dr. Donata Clay on July 25, 2008, at 11:30 a.m.  Prior to this office appointment, a chest x-      ray will be obtained.   DISCHARGE MEDICATIONS:  1. Enteric-coated aspirin 81 mg p.o. daily.  2. Lopressor 25 p.o. two times daily.  3. Lipitor 10 p.o. at bedtime.  4. Advair Diskus 1 puff twice daily.  5. Amiodarone 200 mg p.o. two times daily.  6. Multivitamin p.o. daily.  7. Nu-Iron 150 mg p.o. daily.  8. Lasix 40 mg p.o. daily for 5 days.  9. KCl 20 mEq p.o. daily for 5 days.  10.Ultram 50 mg one to two tablets every 4 to 6 hours as needed pain.      Doree Fudge, [place]      Kerin Perna, M.D.  Electronically Signed    DZ/MEDQ  D:  06/30/2008  T:  07/01/2008  Job:  [id number]   cc:   Colleen Can. Deborah Chalk, M.D.

## 2010-06-08 NOTE — Assessment & Plan Note (Signed)
OFFICE VISIT   Jermaine Wright, Jermaine Wright  DOB:  [birth year]                                        July 25, 2008  CHART #:  [id number]   CURRENT PROBLEMS:  1. Status post coronary artery bypass graft x4, June 25, 2008, for      class IV unstable angina with subendocardial myocardial infarction.  2. Chronically elevated right hemidiaphragm and chronic obstructive      pulmonary disease.  3. Osteoarthritis of the shoulders.   PRESENT ILLNESS:  The patient returns for his first postop office visit  after undergoing multivessel coronary bypass grafting for severe  coronary disease.  He is a 77 year old Caucasian ex-smoker and ex-  drinker who presented with CHF.  The 2-D echo showed LV dysfunction, and  he underwent cardiac cath showing a 90% stenosis of the LAD, 80-90%  stenosis of the dominant circumflex, and 80% stenosis of the ramus  intermediate.  His EF was 45%.  He subsequently underwent left IMA  grafting to his LAD and vein grafts to the posterior descending branch  of the circumflex, the ramus intermediate, and the circumflex marginal.  Following discharge to home, he has been stable.  He has remained in  sinus rhythm, and he is gradually improving his strength.  He has had no  recurrent symptoms of CHF, and the surgical incisions are healing well.   His current medications include Advair, Lipitor, aspirin, Lopressor 25  b.i.d., amiodarone 200 mg daily, vitamins, Ultram, and fish oil.   PHYSICAL EXAMINATION:  Blood pressure 100/60, pulse 70 and regular,  respirations 18, saturation 94% on room air.  He is alert and oriented.  Breath sounds are clear and equal.  The sternum is stable and well  healed.  The leg incision is well healed, and there is no significant  peripheral edema.  Cardiac rhythm is regular without murmur or gallop.   IMAGING:  A PA and lateral chest x-ray reveals stable but low lung  volumes with a mildly elevated right hemidiaphragm.  There  is no  significant pleural effusion.  The sternal wires are intact.   PLAN:  The patient will continue his rehab program and will enroll in  the outpatient hospital based program.  He does not need any more pain  medication, and I told him he could stop taking his iron.  He does not  drive, and he was told not to lift more than 15 pounds until September 24, 2008.  He will return here as needed.   Kerin Perna, M.D.  Electronically Signed   PV/MEDQ  D:  07/25/2008  T:  07/26/2008  Job:  [id number]   cc:   Colleen Can. Deborah Chalk, M.D.  Gaspar Garbe, M.D.

## 2010-06-08 NOTE — Cardiovascular Report (Signed)
NAMEMarland Kitchen  Jermaine Wright, Jermaine Wright NO.:  [id number]   MEDICAL RECORD NO.:  [id number]          PATIENT TYPE:  INP   LOCATION:  2032                         FACILITY:  [facility]   PHYSICIAN:  Colleen Can. Deborah Chalk, M.D.DATE OF BIRTH:  [birth year]   DATE OF PROCEDURE:  06/17/2008  DATE OF DISCHARGE:                            CARDIAC CATHETERIZATION   PROCEDURE:  Left heart catheterization with selective coronary  angiography, left ventricular angiography.   TYPE AND SITE OF ENTRY:  Percutaneous right femoral artery with  AngioSeal.   CATHETER:  6-French four curved Judkins right and left coronary  catheter, 6-French pigtail ventriculographic catheter.   CONTRAST:  Pure Omnipaque.   MEDICATIONS GIVEN PRIOR TO PROCEDURE:  Valium 5 mg p.o.   MEDICATIONS GIVEN DURING THE PROCEDURE:  Versed 2 mg IV and Ancef 1 g  IV.   COMMENTS:  The patient tolerated the procedure well.   HEMODYNAMIC DATA:  The aortic pressure was 105/70.  LV pressure was  105/5-18.  There is no aortic valve gradient noted on pullback.   ANGIOGRAPHIC DATA:  1. Left main coronary artery short and normal.  2. Left circumflex.  Left circumflex is a very large dominant vessel.      There is sequential 95% stenosis in the segmental calcified portion      of the proximal left circumflex.  This continues to a large      posterolateral branch and posterior descending vessel.  There is      moderate 60-70% stenosis after the first distal branch and before      the posterior descending vessel.  3. Left anterior descending.  Left anterior descending is a long      segmental proximal portion and then goes into confluence of vessels      and form somewhat of a tortuous knuckle.  There is 60-80% stenosis      proximal to this bifurcation area.  In the very beginning of a      bifurcating diagonal vessel that seemingly arises first, there      appears to be an 80-90% stenosis at the origin of this bifurcating      diagonal.   The vessel then forms a septal perforating branch and      the continuation of the left anterior descending.  There is a 95%      stenosis at the left anterior descending at the midportion of the      vessel.  The distal left anterior descending has irregularities,      but would be suitable for bypass grafting.   The right coronary artery is a very small nondominant vessel that gives  rise to branches very proximally.  More proximal RV branch extends to  and covers the right ventricle.  There does not appear to be significant  stenosis in the right coronary artery per se.  I did not identify the  vessel that ran in the AV groove.   The left ventricular angiogram was performed in RAO position.  There was  an incomplete filling of the ventricle, but the global ejection fraction  is felt to be 40% with very mild anterior hypokinesis.   The abdominal aortogram demonstrates a discrete abdominal aneurysm that  is infrarenal.  There appears be 1/1/2 times the dimension of the aorta  at that level.  It is after the renal arteries, but before the  bifurcation of the aorta.   OVERALL IMPRESSION:  1. Mild left ventricular dysfunction.  2. Severe stenosis in the mid left anterior descending with severe      proximal stenosis in the proximal left circumflex with a      congenitally small right coronary artery.  3. Abdominal aortic aneurysm.   DISCUSSION:  We will ask [name] to be evaluated by [facility].  I think  it is important to try to get a full assessment of his underlying  medical condition, but clearly he has coronary disease as severe enough  to be the etiology of a lot of his generalized weakness and limitation.      Colleen Can. Deborah Chalk, M.D.  Electronically Signed     SNT/MEDQ  D:  06/17/2008  T:  06/18/2008  Job:  [id number]

## 2010-06-08 NOTE — Consult Note (Signed)
NAME:  Jermaine Wright, Jermaine Wright NO.:  [id number]   MEDICAL RECORD NO.:  [id number]          PATIENT TYPE:  INP   LOCATION:  2306                         FACILITY:  [facility]   PHYSICIAN:  Zenon Mayo, MDDATE OF BIRTH:  [birth year]   DATE OF CONSULTATION:  06/25/2008  DATE OF DISCHARGE:                                 CONSULTATION   PROCEDURE:  Intraoperative transesophageal echocardiogram.   INDICATION:  Coronary artery disease and left ventricular dysfunction.   DESCRIPTION OF PROCEDURE:  Jermaine Wright is a 77 year old male with  history of hypertension, coronary artery disease, and COPD, who was  brought to the operating room today by Dr. Donata Clay for coronary artery  bypass surgery.  Intraoperative transesophageal echocardiogram was  requested to evaluate the patient's ventricular function as well as any  valvular abnormalities that may be found.   The patient was brought to the operating room and placed under general  anesthesia.  After confirmation of endotracheal tube placement and  orogastric suctioning, a transesophageal echo probe was placed in the  patient's esophagus without complication.  The left ventricle was the  structure that was visualized and revealed a dilated ventricle with  normal thickness.  There was global hypokinesis, but only mildly  depressed LV function.  The left ventricular function was estimated to  be 45%.  The mitral valve was then imaged and revealed normal-appearing  leaflets.  These leaflets appeared to move well and coapt well.  There  did not appear to be any mitral annular calcification.  When color  Doppler was placed across the valve, a mild amount of mitral  regurgitation was noted.  The aortic valve was imaged next and revealed  a tricuspid valve with no calcifications or vegetations.  The leaflets  have normal excursion and no aortic insufficiency was noted.  The aortic  valve area was measured by planimetry at 3.02 cm2.   The pulmonic and  tricuspid valves were both normal in structure and function with no  regurgitation noted.  The interatrial septum was intact by color  Doppler.  The thoracic aorta revealed a mild amount of atherosclerotic  disease, but looking at the aortic root, it was difficult to get  accurate measurements because I could not get a really clear view of the  structures.   At the conclusion bypass, the patient was weaned off cardiopulmonary  bypass machine on 3 mcg of dopamine.  The left ventricular function  appeared to have improved slightly, and the patient was weaned from  bypass without complication.  The postbypass exam was essentially  unremarkable with very little change from prebypass evaluation.  The  patient remained stable throughout the postbypass.  At the conclusion of  the procedure, the echo probe was removed without any evidence of  complication.  The patient was taken from the operating room directly to  the Intensive Care Unit in stable fashion.           ______________________________  Zenon Mayo, MD     WEF/MEDQ  D:  06/25/2008  T:  06/26/2008  Job:  [id number]

## 2010-08-31 ENCOUNTER — Encounter: Payer: Self-pay | Admitting: Nurse Practitioner

## 2010-09-07 ENCOUNTER — Other Ambulatory Visit: Payer: Self-pay | Admitting: Cardiology

## 2010-09-07 ENCOUNTER — Ambulatory Visit (INDEPENDENT_AMBULATORY_CARE_PROVIDER_SITE_OTHER): Payer: Medicare Other | Admitting: Nurse Practitioner

## 2010-09-07 ENCOUNTER — Ambulatory Visit (INDEPENDENT_AMBULATORY_CARE_PROVIDER_SITE_OTHER): Payer: Medicare Other | Admitting: *Deleted

## 2010-09-07 ENCOUNTER — Encounter: Payer: Self-pay | Admitting: Nurse Practitioner

## 2010-09-07 VITALS — BP 124/82 | HR 80 | Ht 66.0 in | Wt 212.4 lb

## 2010-09-07 DIAGNOSIS — Z9889 Other specified postprocedural states: Secondary | ICD-10-CM

## 2010-09-07 DIAGNOSIS — I2510 Atherosclerotic heart disease of native coronary artery without angina pectoris: Secondary | ICD-10-CM

## 2010-09-07 DIAGNOSIS — F1010 Alcohol abuse, uncomplicated: Secondary | ICD-10-CM | POA: Insufficient documentation

## 2010-09-07 DIAGNOSIS — I519 Heart disease, unspecified: Secondary | ICD-10-CM

## 2010-09-07 DIAGNOSIS — N400 Enlarged prostate without lower urinary tract symptoms: Secondary | ICD-10-CM

## 2010-09-07 DIAGNOSIS — I714 Abdominal aortic aneurysm, without rupture, unspecified: Secondary | ICD-10-CM

## 2010-09-07 DIAGNOSIS — R0602 Shortness of breath: Secondary | ICD-10-CM

## 2010-09-07 DIAGNOSIS — R972 Elevated prostate specific antigen [PSA]: Secondary | ICD-10-CM

## 2010-09-07 DIAGNOSIS — I5020 Unspecified systolic (congestive) heart failure: Secondary | ICD-10-CM

## 2010-09-07 LAB — COMPREHENSIVE METABOLIC PANEL
Albumin: 4.1 g/dL (ref 3.5–5.2)
Alkaline Phosphatase: 67 U/L (ref 39–117)
BUN: 17 mg/dL (ref 6–23)
CO2: 28 mEq/L (ref 19–32)
Calcium: 9 mg/dL (ref 8.4–10.5)
GFR: 90.95 mL/min (ref >60.00)
Glucose, Bld: 87 mg/dL (ref 70–99)
Potassium: 3.9 mEq/L (ref 3.5–5.1)
Sodium: 144 mEq/L (ref 135–145)
Total Protein: 7.3 g/dL (ref 6.0–8.3)

## 2010-09-07 LAB — LIPID PANEL
Cholesterol: 125 mg/dL (ref 0–200)
VLDL: 14.4 mg/dL (ref 0.0–40.0)

## 2010-09-07 NOTE — Assessment & Plan Note (Signed)
He is doing some binge drinking. He is counseled accordingly.

## 2010-09-07 NOTE — Assessment & Plan Note (Signed)
Prior subendo MI in 2010 with subsequent CABG in 2010. Follow up echo in August of 2010 showed EF of 45%. We will update the echo. It is difficult to discern how much of his shortness of breath is pulmonary versus heart related. His medical regimen could be better but he is not interested in more medicines at this time.  We will tentatively see him back in 6 months unless his studies demonstrate otherwise.

## 2010-09-07 NOTE — Assessment & Plan Note (Signed)
This was noted at time of his cath in 2010. We will arrange for repeat ultrasound.

## 2010-09-07 NOTE — Patient Instructions (Signed)
We are going to check an ultrasound of your belly looking at your aneurysm We are going to check an ultrasound of your heart to see how strong your pumping function is Stay on your current medicines.  We will tentatively see you back in 6 months. You will see Dr. Angelina Sheriff at your next visit with labs

## 2010-09-07 NOTE — Progress Notes (Signed)
Jermaine Wright Date of Birth: August 28, 1928   History of Present Illness: Jermaine Wright is seen today for his 6 month visit. He is seen for Dr. Antoine Poche. He is a former patient of Dr. Ronnald Nian. He is here with his family. He has been doing fairly well. No real chest pain. He is short of breath. It may be a bit worse. He is not really able to discern if it is or not. He has been drinking some and he has a tendency to binge drink. He was cautioned. He has some dizziness in the mornings but thinks its because he gets up too quick. No syncope reported. He is mostly limited by his arthritis. He has not fallen. He is using his cane.   Current Outpatient Prescriptions on File Prior to Visit  Medication Sig Dispense Refill  . albuterol (PROVENTIL) (2.5 MG/3ML) 0.083% nebulizer solution Take 2.5 mg by nebulization every 6 (six) hours as needed.        Marland Kitchen aspirin 81 MG tablet Take 81 mg by mouth daily.        Marland Kitchen atorvastatin (LIPITOR) 10 MG tablet Take 10 mg by mouth daily.        . diclofenac sodium (VOLTAREN) 1 % GEL Apply topically as needed.        . fish oil-omega-3 fatty acids 1000 MG capsule Take 1,200 mg by mouth 2 (two) times daily.        . Fluticasone-Salmeterol (ADVAIR DISKUS) 250-50 MCG/DOSE AEPB Inhale 1 puff into the lungs every 12 (twelve) hours.        . Glucosamine HCl-MSM 1500-500 MG/30ML LIQD Take by mouth daily.        Marland Kitchen guaiFENesin (MUCINEX) 600 MG 12 hr tablet Take 1,200 mg by mouth daily.        . Multiple Vitamin (MULTIVITAMIN) tablet Take 1 tablet by mouth daily.        Marland Kitchen DISCONTD: metoprolol tartrate (LOPRESSOR) 25 MG tablet Take 25 mg by mouth 2 (two) times daily.          No Known Allergies  Past Medical History  Diagnosis Date  . COPD (chronic obstructive pulmonary disease)   . Arthritis   . Hemidiaphragm paralysis     RIGHT HEMIDIAPHRAGM  . SOB (shortness of breath)   . OA (osteoarthritis)   . Asthma   . Herpes 02/2008  . H/O: asbestos exposure   . BPH with elevated PSA     . AAA (abdominal aortic aneurysm)   . LV dysfunction     EF 40-45% per echo in 2010  . CAD (coronary artery disease)     Prior MI with CABG in 2010    Past Surgical History  Procedure Date  . Cardiac catheterization 06/17/2008    THERE WAS INCOMPLETE FILLING OF THE VENTRICLE, EF 40% WITH VERY MILD ANTERIOR HYPOKINESIS  . Coronary artery bypass graft 06/2008    FOR CLASS IV UNSTABLE ANGINA WITH SUBENDOCARDIAL INFARCTION  . US echocardiography 09/03/2008    EF 40-45%    History  Smoking status  . Former Smoker  . Quit date: 08/31/1995  Smokeless tobacco  . Not on file    History  Alcohol Use  . Yes    Family History  Problem Relation Age of Onset  . Heart attack Mother   . Aneurysm Mother     AAA    Review of Systems: The review of systems is positive for arthritis. He has some cough. He is short of  breath. No recent follow up of his AAA. He is not active. Weight continues to go up. No edema.  All other systems were reviewed and are negative.  Physical Exam: BP 124/82  Pulse 80  Ht 5\' 6"  (1.676 m)  Wt 212 lb 6.4 oz (96.344 kg)  BMI 34.28 kg/m2  SpO2 97% Patient is very pleasant and in no acute distress. Skin is warm and dry. Color is normal.  HEENT is unremarkable. Normocephalic/atraumatic. PERRL. Sclera are nonicteric. Neck is supple. No masses. No JVD. Lungs are clear. Cardiac exam shows a regular rate and rhythm. Abdomen is soft. Extremities are without edema. Gait and ROM are intact. He is using a cane. No gross neurologic deficits noted.  LABORATORY DATA: PENDING   Assessment / Plan:

## 2010-09-07 NOTE — Assessment & Plan Note (Signed)
Echo will be updated.

## 2010-09-09 ENCOUNTER — Other Ambulatory Visit: Payer: Self-pay | Admitting: Cardiology

## 2010-09-10 ENCOUNTER — Telehealth: Payer: Self-pay | Admitting: Nurse Practitioner

## 2010-09-10 DIAGNOSIS — I2510 Atherosclerotic heart disease of native coronary artery without angina pectoris: Secondary | ICD-10-CM

## 2010-09-10 NOTE — Telephone Encounter (Signed)
Notified wife of lab results. Will see Dr. Antoine Poche in 6 mo and will have repeat labs then;bmet,lp,hfp;ordered

## 2010-09-10 NOTE — Telephone Encounter (Signed)
Message copied by Lorayne Bender on Fri Sep 10, 2010  4:33 PM ------      Message from: Cassell Clement      Created: Wed Sep 08, 2010  3:11 PM       Patient report.  The cholesterol is normal it 125  The chemistries are normal.  Continue same medication.

## 2010-09-10 NOTE — Telephone Encounter (Signed)
escribe medication per fax request  

## 2010-09-10 NOTE — Telephone Encounter (Signed)
Please call wife back.  She has returned a call about lab results the patient had done on last Tuesday.

## 2010-09-17 ENCOUNTER — Ambulatory Visit
Admission: RE | Admit: 2010-09-17 | Discharge: 2010-09-17 | Disposition: A | Discharge status: Home / Self Care | Payer: Medicare Other | Source: Ambulatory Visit | Attending: Nurse Practitioner | Admitting: Nurse Practitioner

## 2010-09-17 ENCOUNTER — Ambulatory Visit (HOSPITAL_COMMUNITY): Payer: Medicare Other | Attending: Nurse Practitioner | Admitting: Radiology

## 2010-09-17 DIAGNOSIS — I2510 Atherosclerotic heart disease of native coronary artery without angina pectoris: Secondary | ICD-10-CM | POA: Insufficient documentation

## 2010-09-17 DIAGNOSIS — R0609 Other forms of dyspnea: Secondary | ICD-10-CM | POA: Insufficient documentation

## 2010-09-17 DIAGNOSIS — I714 Abdominal aortic aneurysm, without rupture: Secondary | ICD-10-CM

## 2010-09-17 DIAGNOSIS — I059 Rheumatic mitral valve disease, unspecified: Secondary | ICD-10-CM | POA: Insufficient documentation

## 2010-09-17 DIAGNOSIS — R0602 Shortness of breath: Secondary | ICD-10-CM

## 2010-09-17 DIAGNOSIS — R0989 Other specified symptoms and signs involving the circulatory and respiratory systems: Secondary | ICD-10-CM | POA: Insufficient documentation

## 2010-09-17 DIAGNOSIS — I079 Rheumatic tricuspid valve disease, unspecified: Secondary | ICD-10-CM | POA: Insufficient documentation

## 2010-09-17 DIAGNOSIS — I509 Heart failure, unspecified: Secondary | ICD-10-CM | POA: Insufficient documentation

## 2010-09-17 DIAGNOSIS — J449 Chronic obstructive pulmonary disease, unspecified: Secondary | ICD-10-CM | POA: Insufficient documentation

## 2010-09-17 DIAGNOSIS — J4489 Other specified chronic obstructive pulmonary disease: Secondary | ICD-10-CM | POA: Insufficient documentation

## 2010-09-21 ENCOUNTER — Telehealth: Payer: Self-pay | Admitting: *Deleted

## 2010-09-21 NOTE — Telephone Encounter (Signed)
Message copied by Lorayne Bender on Tue Sep 21, 2010  9:54 AM ------      Message from: Rosalio Macadamia      Created: Fri Sep 17, 2010  1:03 PM       Ok to report. He does have gallstones noted. ? Having any GI symptoms      Need to repeat in one year for follow up of his AAA

## 2010-09-21 NOTE — Telephone Encounter (Signed)
Notified of US abdomen results. Will need follow up in 1 year. Will send copy to Dr. Wylene Simmer and recall for [place] of abdomen for AAA

## 2010-11-04 ENCOUNTER — Other Ambulatory Visit: Payer: Self-pay | Admitting: Cardiology

## 2010-11-07 ENCOUNTER — Other Ambulatory Visit: Payer: Self-pay | Admitting: Cardiology

## 2011-03-01 ENCOUNTER — Other Ambulatory Visit: Payer: Self-pay | Admitting: *Deleted

## 2011-03-01 ENCOUNTER — Other Ambulatory Visit: Payer: Self-pay | Admitting: Cardiothoracic Surgery

## 2011-03-01 MED ORDER — METOPROLOL TARTRATE 25 MG PO TABS: 25.0000 mg | ORAL_TABLET | Freq: Two times a day (BID) | ORAL | Status: DC

## 2011-03-01 NOTE — Telephone Encounter (Signed)
Please send refill request to [facility]

## 2011-03-07 ENCOUNTER — Ambulatory Visit (INDEPENDENT_AMBULATORY_CARE_PROVIDER_SITE_OTHER): Payer: Medicare Other | Admitting: Cardiology

## 2011-03-07 ENCOUNTER — Encounter: Payer: Self-pay | Admitting: Cardiology

## 2011-03-07 DIAGNOSIS — R0989 Other specified symptoms and signs involving the circulatory and respiratory systems: Secondary | ICD-10-CM

## 2011-03-07 DIAGNOSIS — I714 Abdominal aortic aneurysm, without rupture, unspecified: Secondary | ICD-10-CM

## 2011-03-07 DIAGNOSIS — I2510 Atherosclerotic heart disease of native coronary artery without angina pectoris: Secondary | ICD-10-CM

## 2011-03-07 DIAGNOSIS — I519 Heart disease, unspecified: Secondary | ICD-10-CM

## 2011-03-07 DIAGNOSIS — E669 Obesity, unspecified: Secondary | ICD-10-CM

## 2011-03-07 NOTE — Assessment & Plan Note (Signed)
His last EF was 50 - 55%.  No change in therapy is indicated.

## 2011-03-07 NOTE — Patient Instructions (Signed)
Continue current medications as listed.  Your physician has requested that you have a carotid duplex. This test is an ultrasound of the carotid arteries in your neck. It looks at blood flow through these arteries that supply the brain with blood. Allow one hour for this exam. There are no restrictions or special instructions.  Follow up with Jermaine Wright in 6 months

## 2011-03-07 NOTE — Assessment & Plan Note (Signed)
The patient has no new sypmtoms.  No further cardiovascular testing is indicated.  We will continue with aggressive risk reduction and meds as listed.  

## 2011-03-07 NOTE — Assessment & Plan Note (Signed)
He had minimal dilatation on recent ultrasound. No followup is indicated at this point though I will probably repeat this in 24 months.

## 2011-03-07 NOTE — Assessment & Plan Note (Signed)
He does have a right carotid bruit and I will order carotid Dopplers.

## 2011-03-07 NOTE — Assessment & Plan Note (Signed)
The patient understands the need to lose weight with diet and exercise. We have discussed specific strategies for this.  

## 2011-03-07 NOTE — Progress Notes (Signed)
HPI The patient presents as a new patient for me having previously been treated by Dr.Tennant.  Since last being seen he has done well.  He rides an exercise bike 30 minutes every other day.  He might get winded at 20 - 30 minutes.  The patient denies any new symptoms such as chest discomfort, neck or arm discomfort. There has been no new  PND or orthopnea. There have been no reported palpitations or syncope.  He does have some mild orthostasis.   No Known Allergies  Current Outpatient Prescriptions  Medication Sig Dispense Refill  . albuterol (PROVENTIL) (2.5 MG/3ML) 0.083% nebulizer solution Take 2.5 mg by nebulization every 6 (six) hours as needed.        Marland Kitchen aspirin 81 MG tablet Take 81 mg by mouth daily.        Marland Kitchen atorvastatin (LIPITOR) 10 MG tablet TAKE ONE TABLET BY MOUTH AT BEDTIME  30 tablet  5  . diclofenac sodium (VOLTAREN) 1 % GEL Apply topically as needed.        . fish oil-omega-3 fatty acids 1000 MG capsule Take 1,200 mg by mouth 2 (two) times daily.        . Fluticasone-Salmeterol (ADVAIR DISKUS) 250-50 MCG/DOSE AEPB Inhale 1 puff into the lungs every 12 (twelve) hours.        . Glucosamine HCl-MSM 1500-500 MG/30ML LIQD Take by mouth daily.        Marland Kitchen guaiFENesin (MUCINEX) 600 MG 12 hr tablet Take 1,200 mg by mouth daily.        . metoprolol tartrate (LOPRESSOR) 25 MG tablet Take 1 tablet (25 mg total) by mouth 2 (two) times daily.  60 tablet  5  . Multiple Vitamin (MULTIVITAMIN) tablet Take 1 tablet by mouth daily.        . Pseudoephedrine-Guaifenesin (MUCINEX D PO) Take by mouth daily.      Marland Kitchen tiotropium (SPIRIVA) 18 MCG inhalation capsule Place 18 mcg into inhaler and inhale daily.        Past Medical History  Diagnosis Date  . COPD (chronic obstructive pulmonary disease)   . Arthritis   . Hemidiaphragm paralysis     RIGHT HEMIDIAPHRAGM  . SOB (shortness of breath)   . OA (osteoarthritis)   . Asthma   . Herpes 02/2008  . H/O: asbestos exposure   . BPH with elevated PSA    . AAA (abdominal aortic aneurysm)   . LV dysfunction     EF 40-45% per echo in 2010  . CAD (coronary artery disease)     Prior MI with CABG in 2010    Past Surgical History  Procedure Date  . Cardiac catheterization 06/17/2008    THERE WAS INCOMPLETE FILLING OF THE VENTRICLE, EF 40% WITH VERY MILD ANTERIOR HYPOKINESIS  . Coronary artery bypass graft 06/2008    FOR CLASS IV UNSTABLE ANGINA WITH SUBENDOCARDIAL INFARCTION  . US echocardiography 09/03/2008    EF 40-45%    ROS:  As stated in the HPI and negative for all other systems.  PHYSICAL EXAM BP 120/80  Pulse 63  Ht 5\' 8"  (1.727 m)  Wt 214 lb (97.07 kg)  BMI 32.54 kg/m2 GENERAL:  Well appearing HEENT:  Pupils equal round and reactive, fundi not visualized, oral mucosa unremarkable NECK:  No jugular venous distention, waveform within normal limits, carotid upstroke brisk and symmetric, right bruit, no thyromegaly LYMPHATICS:  No cervical, inguinal adenopathy LUNGS:  Clear to auscultation bilaterally BACK:  No CVA tenderness CHEST:  Well healed sternotomy scar. HEART:  PMI not displaced or sustained,S1 and S2 within normal limits, no S3, no S4, no clicks, no rubs, no murmurs ABD:  Flat, positive bowel sounds normal in frequency in pitch, no bruits, no rebound, no guarding, no midline pulsatile mass, no hepatomegaly, no splenomegaly, obese EXT:  2 plus pulses throughout, no edema, no cyanosis no clubbing SKIN:  No rashes no nodules NEURO:  Cranial nerves II through XII grossly intact, motor grossly intact throughout PSYCH:  Cognitively intact, oriented to person place and time  EKG:  Sinus rhythm, rate 63, low voltage in limb leads, premature ventricular contractions, no acute ST-T wave changes. 03/07/2011  ASSESSMENT AND PLAN

## 2011-03-21 ENCOUNTER — Encounter (INDEPENDENT_AMBULATORY_CARE_PROVIDER_SITE_OTHER): Payer: Medicare Other

## 2011-03-21 DIAGNOSIS — R0989 Other specified symptoms and signs involving the circulatory and respiratory systems: Secondary | ICD-10-CM

## 2011-03-21 DIAGNOSIS — I6529 Occlusion and stenosis of unspecified carotid artery: Secondary | ICD-10-CM

## 2011-07-05 ENCOUNTER — Other Ambulatory Visit: Payer: Self-pay | Admitting: Cardiology

## 2011-07-05 MED ORDER — ATORVASTATIN CALCIUM 10 MG PO TABS: 10.0000 mg | ORAL_TABLET | Freq: Every day | ORAL | Status: DC

## 2011-07-06 ENCOUNTER — Other Ambulatory Visit: Payer: Self-pay | Admitting: Urology

## 2011-07-07 ENCOUNTER — Encounter (HOSPITAL_COMMUNITY): Payer: Self-pay | Admitting: Pharmacy Technician

## 2011-07-08 ENCOUNTER — Encounter (HOSPITAL_COMMUNITY)
Admission: RE | Admit: 2011-07-08 | Discharge: 2011-07-08 | Disposition: A | Discharge status: Home / Self Care | Payer: Medicare Other | Source: Ambulatory Visit | Attending: Urology | Admitting: Urology

## 2011-07-08 ENCOUNTER — Encounter (HOSPITAL_COMMUNITY): Payer: Self-pay

## 2011-07-08 LAB — SURGICAL PCR SCREEN
MRSA, PCR: NEGATIVE
Staphylococcus aureus: POSITIVE — AB

## 2011-07-08 LAB — BASIC METABOLIC PANEL
GFR calc Af Amer: 78 mL/min — ABNORMAL LOW (ref >90)
GFR calc non Af Amer: 67 mL/min — ABNORMAL LOW (ref >90)
Potassium: 4.4 mEq/L (ref 3.5–5.1)
Sodium: 137 mEq/L (ref 135–145)

## 2011-07-08 LAB — CBC
MCHC: 33.5 g/dL (ref 30.0–36.0)
Platelets: 160 10*3/uL (ref 150–400)
RDW: 14.5 % (ref 11.5–15.5)

## 2011-07-08 MED ORDER — CEFAZOLIN SODIUM 1-5 GM-% IV SOLN: 1.0000 g | INTRAVENOUS | Status: DC

## 2011-07-08 NOTE — Pre-Procedure Instructions (Addendum)
03-18-2011 last cardiology office visit dr hochrein epic 09-17-2010 2 d echo epic 03-19-2011 carotid doppler epic US aorta 11-06-2009 epic Chest 2 view xray 04-25-2011 [facility] on chart

## 2011-07-08 NOTE — Patient Instructions (Signed)
20 Jermaine Wright  07/08/2011   Your procedure is scheduled on:  07-11-2011  Report to [facility] at  0700 AM.  Call this number if you have problems the morning of surgery: [phone]   Remember:   Do not eat food or drink liquids:After Midnight.  .  Take these medicines the morning of surgery with A SIP OF WATER: metoprolol, advair   Do not wear jewelry or make up.  Do not wear lotions, powders, or perfumes.Do not wear deodorant.    Do not bring valuables to the hospital.  Contacts, dentures or bridgework may not be worn into surgery.  Leave suitcase in the car. After surgery it may be brought to your room.  For patients admitted to the hospital, checkout time is 11:00 AM the day of                         discharge.     Special Instructions: CHG Shower Use Special Wash: 1/2 bottle night before surgery and 1/2 bottle morning of surgery, use regular soap on face and front and back private area.   Please read over the following fact sheets that you were given: MRSA Information  [facility] pre op nurse phone number [phone], call if needed

## 2011-07-11 ENCOUNTER — Ambulatory Visit (HOSPITAL_COMMUNITY)
Admission: RE | Admit: 2011-07-11 | Discharge: 2011-07-11 | Disposition: A | Discharge status: Home / Self Care | Payer: Medicare Other | Source: Ambulatory Visit | Attending: Urology | Admitting: Urology

## 2011-07-11 ENCOUNTER — Encounter (HOSPITAL_COMMUNITY)
Admission: RE | Disposition: A | Discharge status: Home / Self Care | Payer: Self-pay | Source: Ambulatory Visit | Attending: Urology

## 2011-07-11 ENCOUNTER — Encounter (HOSPITAL_COMMUNITY): Payer: Self-pay | Admitting: Anesthesiology

## 2011-07-11 ENCOUNTER — Encounter (HOSPITAL_COMMUNITY): Payer: Self-pay | Admitting: *Deleted

## 2011-07-11 ENCOUNTER — Ambulatory Visit (HOSPITAL_COMMUNITY): Payer: Medicare Other | Admitting: Anesthesiology

## 2011-07-11 DIAGNOSIS — I10 Essential (primary) hypertension: Secondary | ICD-10-CM | POA: Insufficient documentation

## 2011-07-11 DIAGNOSIS — I2510 Atherosclerotic heart disease of native coronary artery without angina pectoris: Secondary | ICD-10-CM | POA: Insufficient documentation

## 2011-07-11 DIAGNOSIS — Z01812 Encounter for preprocedural laboratory examination: Secondary | ICD-10-CM | POA: Insufficient documentation

## 2011-07-11 DIAGNOSIS — Z79899 Other long term (current) drug therapy: Secondary | ICD-10-CM | POA: Insufficient documentation

## 2011-07-11 DIAGNOSIS — N432 Other hydrocele: Secondary | ICD-10-CM | POA: Insufficient documentation

## 2011-07-11 DIAGNOSIS — Z951 Presence of aortocoronary bypass graft: Secondary | ICD-10-CM | POA: Insufficient documentation

## 2011-07-11 DIAGNOSIS — J4489 Other specified chronic obstructive pulmonary disease: Secondary | ICD-10-CM | POA: Insufficient documentation

## 2011-07-11 DIAGNOSIS — N400 Enlarged prostate without lower urinary tract symptoms: Secondary | ICD-10-CM | POA: Insufficient documentation

## 2011-07-11 DIAGNOSIS — J449 Chronic obstructive pulmonary disease, unspecified: Secondary | ICD-10-CM | POA: Insufficient documentation

## 2011-07-11 DIAGNOSIS — I252 Old myocardial infarction: Secondary | ICD-10-CM | POA: Insufficient documentation

## 2011-07-11 HISTORY — PX: HYDROCELE EXCISION: SHX482

## 2011-07-11 SURGERY — HYDROCELECTOMY
Anesthesia: General | Site: Scrotum | Laterality: Left | Wound class: Clean Contaminated

## 2011-07-11 MED ORDER — PROMETHAZINE HCL 25 MG/ML IJ SOLN: 6.2500 mg | INTRAMUSCULAR | Status: DC | PRN

## 2011-07-11 MED ORDER — LACTATED RINGERS IV SOLN
INTRAVENOUS | Status: DC
  Administered 2011-07-11: 11:00:00 via INTRAVENOUS

## 2011-07-11 MED ORDER — CEFAZOLIN SODIUM-DEXTROSE 2-3 GM-% IV SOLR
2.0000 g | Freq: Once | INTRAVENOUS | Status: AC
  Administered 2011-07-11: 2 g via INTRAVENOUS

## 2011-07-11 MED ORDER — LACTATED RINGERS IV SOLN
INTRAVENOUS | Status: DC | PRN
  Administered 2011-07-11: 08:00:00 via INTRAVENOUS

## 2011-07-11 MED ORDER — FENTANYL CITRATE 0.05 MG/ML IJ SOLN: 25.0000 ug | INTRAMUSCULAR | Status: DC | PRN

## 2011-07-11 MED ORDER — BUPIVACAINE IN DEXTROSE 0.75-8.25 % IT SOLN
INTRATHECAL | Status: DC | PRN
  Administered 2011-07-11: 1.5 mL via INTRATHECAL

## 2011-07-11 MED ORDER — PROPOFOL 10 MG/ML IV EMUL
INTRAVENOUS | Status: DC | PRN
  Administered 2011-07-11: 25 ug/kg/min via INTRAVENOUS

## 2011-07-11 MED ORDER — CEFAZOLIN SODIUM-DEXTROSE 2-3 GM-% IV SOLR
INTRAVENOUS | Status: AC
  Filled 2011-07-11: qty 50

## 2011-07-11 MED ORDER — BUPIVACAINE HCL (PF) 0.25 % IJ SOLN
INTRAMUSCULAR | Status: AC
  Filled 2011-07-11: qty 30

## 2011-07-11 MED ORDER — HYDROCODONE-ACETAMINOPHEN 5-500 MG PO CAPS: 1.0000 | ORAL_CAPSULE | ORAL | Status: AC | PRN

## 2011-07-11 MED ORDER — MUPIROCIN 2 % EX OINT
TOPICAL_OINTMENT | CUTANEOUS | Status: AC
  Filled 2011-07-11: qty 22

## 2011-07-11 MED ORDER — FENTANYL CITRATE 0.05 MG/ML IJ SOLN
INTRAMUSCULAR | Status: DC | PRN
  Administered 2011-07-11: 25 ug via INTRAVENOUS
  Administered 2011-07-11 (×2): 50 ug via INTRAVENOUS

## 2011-07-11 MED ORDER — BUPIVACAINE HCL (PF) 0.25 % IJ SOLN
INTRAMUSCULAR | Status: DC | PRN
  Administered 2011-07-11: 7 mL

## 2011-07-11 SURGICAL SUPPLY — 22 items
BANDAGE CONFORM 4  STR 12 LF (GAUZE/BANDAGES/DRESSINGS) ×2 IMPLANT
BANDAGE GAUZE ELAST BULKY 4 IN (GAUZE/BANDAGES/DRESSINGS) ×2 IMPLANT
CLOTH BEACON ORANGE TIMEOUT ST (SAFETY) ×2 IMPLANT
COVER SURGICAL LIGHT HANDLE (MISCELLANEOUS) ×2 IMPLANT
DRAIN PENROSE 18X1/4 LTX STRL (WOUND CARE) ×2 IMPLANT
DRAPE LAPAROTOMY T 102X78X121 (DRAPES) ×2 IMPLANT
ELECT NEEDLE TIP 2.8 STRL (NEEDLE) ×2 IMPLANT
ELECT REM PT RETURN 9FT ADLT (ELECTROSURGICAL) ×2
ELECTRODE REM PT RTRN 9FT ADLT (ELECTROSURGICAL) ×1 IMPLANT
GLOVE BIOGEL M 8.0 STRL (GLOVE) ×2 IMPLANT
GOWN STRL REIN XL XLG (GOWN DISPOSABLE) ×2 IMPLANT
KIT BASIN OR (CUSTOM PROCEDURE TRAY) ×2 IMPLANT
NEEDLE HYPO 22GX1.5 SAFETY (NEEDLE) IMPLANT
NS IRRIG 1000ML POUR BTL (IV SOLUTION) ×2 IMPLANT
PACK GENERAL/GYN (CUSTOM PROCEDURE TRAY) ×4 IMPLANT
SUPPORT SCROTAL LG STRP (MISCELLANEOUS) ×2 IMPLANT
SUT CHROMIC 2 0 SH (SUTURE) ×2 IMPLANT
SUT CHROMIC 3 0 SH 27 (SUTURE) ×2 IMPLANT
SUT CHROMIC 4 0 SH 27 (SUTURE) ×2 IMPLANT
SUT MNCRL AB 4-0 PS2 18 (SUTURE) ×2 IMPLANT
SYR CONTROL 10ML LL (SYRINGE) ×2 IMPLANT
WATER STERILE IRR 1500ML POUR (IV SOLUTION) ×2 IMPLANT

## 2011-07-11 NOTE — Anesthesia Procedure Notes (Signed)
Spinal  Patient location during procedure: OR Start time: 07/11/2011 8:59 AM Staffing Anesthesiologist: Azell Der Performed by: anesthesiologist  Preanesthetic Checklist Completed: patient identified, site marked, surgical consent, pre-op evaluation, timeout performed, IV checked, risks and benefits discussed and monitors and equipment checked Spinal Block Patient position: sitting Prep: Betadine Patient monitoring: heart rate, continuous pulse ox and blood pressure Injection technique: single-shot Needle Needle type: Spinocan  Needle gauge: 22 G Needle length: 9 cm Additional Notes Expiration date of kit checked and confirmed. Patient tolerated procedure well, without complications.

## 2011-07-11 NOTE — Anesthesia Postprocedure Evaluation (Signed)
  Anesthesia Post-op Note  Patient: Jermaine Wright  Procedure(s) Performed: Procedure(s) (LRB): HYDROCELECTOMY ADULT (Left)  Patient Location: PACU  Anesthesia Type: Spinal  Level of Consciousness: awake and alert   Airway and Oxygen Therapy: Patient Spontanous Breathing  Post-op Pain: mild  Post-op Assessment: Post-op Vital signs reviewed, Patient's Cardiovascular Status Stable, Respiratory Function Stable, Patent Airway and No signs of Nausea or vomiting  Post-op Vital Signs: stable  Complications: No apparent anesthesia complications. Moving both feet.

## 2011-07-11 NOTE — Discharge Instructions (Signed)
Scrotal surgery postoperative instructions ° °Wound: ° °In most cases your incision will have absorbable sutures that will dissolve within the first 2-3 weeks. Some will fall out even earlier. Expect some redness as the sutures dissolve but this should occur only around the sutures. If there is generalized redness, especially with increasing pain or swelling, let us know. The scrotum will very likely get "black and blue" as the blood in the tissues spread. Sometimes the whole scrotum will turn colors. The black and blue is followed by a yellow and brown color. In time, all the discoloration will go away. In some cases some firm swelling in the area of the testicle may persist for up to 4-6 weeks after the surgery and is considered normal in most cases. ° °Drain: ° °If the surgeon placed a drain through the bottom part of your scrotum, it is held in with a small stitch. When instructed, cut the small stitch and slide to drain out. Once the drain has been removed, a small hole made drain out for another day or 2. If so, keep a clean washcloth underneath your supportive undergarment, or sterile gauze. Until the hole seals up, all bathing should be in the shower, and not in the bathtub. ° °Diet: ° °You may return to your normal diet within 24 hours following your surgery. You may note some mild nausea and possibly vomiting the first 6-8 hours following surgery. This is usually due to the side effects of anesthesia, and will disappear quite soon. I would suggest clear liquids and a very light meal the first evening following your surgery. ° °Activity: ° °Your physical activity should be restricted the first 48 hours. During that time you should remain relatively inactive, moving about only when necessary. During the first 7-10 days following surgery you should avoid lifting any heavy objects (anything greater than 15 pounds), and avoid strenuous exercise. If you work, ask us specifically about your restrictions, both for  work and home. We will write a note to your employer if needed. ° °You should plan to wear a tight pair of jockey shorts or an athletic supporter for the first 4-5 days, even to sleep. This will keep the scrotum immobilized to some degree and keep the swelling down. You may find it more comfortable to wear a support longer. ° °Ice packs should be placed on and off over the scrotum for the first 48 hours. Frozen peas or corn in a ZipLock bag can be frozen, used and re-frozen. Fifteen minutes on and 15 minutes off is a reasonable schedule. The ice is a good pain reliever and keeps the swelling down. ° °Hygiene: ° °You may shower 48 hours after your surgery. Tub bathing should be restricted until the seventh day. ° ° ° ° ° ° ° ° ° °Medication: ° °You will be sent home with some type of pain medication. In many cases you will be sent home with a narcotic pain pill (Vicodin or Tylox). If the pain is not too bad, you may take either Tylenol (acetaminophen) or Advil (ibuprofen) which contain no narcotic agents, and might be tolerated a little better, with fewer side effects. If the pain medication you are sent home with does not control the pain, you will have to let us know. Some narcotic pain medications cannot be given or refilled by a phone call to a pharmacy. ° °Problems you should report to us: ° °· Fever of 101.0 degrees Fahrenheit or greater. °· Moderate or severe swelling   under the skin incision or involving the scrotum. °· Drug reaction such as hives, a rash, nausea or vomiting. °

## 2011-07-11 NOTE — Transfer of Care (Signed)
Immediate Anesthesia Transfer of Care Note  Patient: Jermaine Wright  Procedure(s) Performed: Procedure(s) (LRB): HYDROCELECTOMY ADULT (Left)  Patient Location: PACU  Anesthesia Type: sab   Level of Consciousness: awake  Airway & Oxygen Therapy: Patient Spontanous Breathing and Patient connected to face mask oxygen  Post-op Assessment: Report given to PACU RN and Post -op Vital signs reviewed and stable  Post vital signs: Reviewed and stable  Complications: No apparent anesthesia complications

## 2011-07-11 NOTE — Progress Notes (Signed)
Dermatones intact and pt able to ambulate to bathroom with walker and assist of one. Pt uses straight cane at home. Unable to urinate. Back to bed.

## 2011-07-11 NOTE — Op Note (Signed)
Preoperative diagnosis: Left hydrocele   Postoperative diagnosis: Same   Procedure:Left hydrocele repair   Surgeon: Cheryel Kyte M. Trimaine Maser, M.D.   Anesthesia: Gen.   Indications: Patient presented with scrotal swelling. A hydrocele was confirmed with scrotal ultrasonography. The patient was symptomatic from his hydrocele and requested surgical intervention. He appeared to understand the risks benefits potential complications of this procedure.   Procedure: The patient was properly identified and marked in the holding room. He received preoperative IV antibiotics. He was then taken to the operating room where general anesthetic was administered using the LMA. The scrotum was then prepped and draped in the usual manner. Appropriate surgical timeout was performed. An incision was made in the median raphae of the scrotum. The hydrocele was encountered which was freed from the scrotal wall and then the testis and hydrocele sac were delivered from the incision. The hydrocele sac was then incised anteriorly and a large amount of fluid was obtained. The testis was carefully inspected and no other pathology was appreciated. The hydrocele sac was moderate in thickness.  The sac was then plicated posteriorly with a running 3-0 chromic suture. The spermatic cord block was then performed with quarter percent Marcaine. The testis was returned to the hemiscrotum taking great care to make sure that there was no twisting of the spermatic cord. Inspection of the inside of the scrotum revealed no significant bleeding. A quarter-inch Penrose was brought through the lower aspect of the scrotum into the affected hemiscrotum and sutured to the skin. The scrotal incision was then closed anatomically, first with a running 3-0 chromic reapproximating the dartos fascia, and then a 4-0 Monocryl used to close the skin in a simple running fashion. A fluff dressing and jockstrap was then placed Sponge and needle counts were correct. No  obvious complications occurred and the patient was brought to recovery room in stable condition.         

## 2011-07-11 NOTE — Progress Notes (Signed)
RN called holding desk in surgery to question if daughter could speak to anesthesia prior to surgery. Daughter will go to holding area with her father.

## 2011-07-11 NOTE — H&P (Signed)
Urology History and Physical Exam  CC: Hydrocele  HPI: 78 year old male presents now for definitive management of the left hydrocele. He originally presented about 4 months ago with significant swelling of his left hemiscrotum. At that point, I recommended conservative management. He presented again last week with significant discomfort from this. He has had an ultrasound which is revealed a hydrocele and no underlying testicular abnormality. He is aware of risks and complications of hydrocelectomy and desires to proceed.    PMH: Past Medical History  Diagnosis Date  . COPD (chronic obstructive pulmonary disease)   . Arthritis   . Hemidiaphragm paralysis     RIGHT HEMIDIAPHRAGM  . SOB (shortness of breath)   . OA (osteoarthritis)   . Asthma   . Herpes 02/2008  . H/O: asbestos exposure   . BPH with elevated PSA   . AAA (abdominal aortic aneurysm)   . LV dysfunction     EF 55% per echo in 8/12  . CAD (coronary artery disease)     Prior MI with CABG in 2010    PSH: Past Surgical History  Procedure Date  . Cardiac catheterization 06/17/2008    THERE WAS INCOMPLETE FILLING OF THE VENTRICLE, EF 40% WITH VERY MILD ANTERIOR HYPOKINESIS  . Coronary artery bypass graft 06/2008    FOR CLASS IV UNSTABLE ANGINA WITH SUBENDOCARDIAL INFARCTION  . US echocardiography 09/03/2008    EF 40-45%  . Hernia repair 1954    Allergies: Allergies  Allergen Reactions  . Chlorhexidine Gluconate (Chlorhexidine) Rash    Medications: Prescriptions prior to admission  Medication Sig Dispense Refill  . albuterol (PROVENTIL HFA;VENTOLIN HFA) 108 (90 BASE) MCG/ACT inhaler Inhale 2 puffs into the lungs every 6 (six) hours as needed. Wheezing and shortness of breath      . aspirin 81 MG tablet Take 81 mg by mouth daily.        Marland Kitchen atorvastatin (LIPITOR) 10 MG tablet Take 10 mg by mouth every evening.      . diclofenac sodium (VOLTAREN) 1 % GEL Apply topically as needed.        . fish oil-omega-3 fatty  acids 1000 MG capsule Take 1,200 mg by mouth 2 (two) times daily.        . Fluticasone-Salmeterol (ADVAIR DISKUS) 250-50 MCG/DOSE AEPB Inhale 1 puff into the lungs every 12 (twelve) hours.        . metoprolol tartrate (LOPRESSOR) 25 MG tablet Take 1 tablet (25 mg total) by mouth 2 (two) times daily.  60 tablet  5  . Multiple Vitamin (MULTIVITAMIN) tablet Take 1 tablet by mouth daily.        . Pseudoephedrine-Guaifenesin (MUCINEX D PO) Take by mouth daily.      Marland Kitchen tiotropium (SPIRIVA) 18 MCG inhalation capsule Place 18 mcg into inhaler and inhale every evening.       . naproxen sodium (ANAPROX) 220 MG tablet Take 220 mg by mouth 2 (two) times daily with a meal.         Social History: History   Social History  . Marital Status: Widowed    Spouse Name: N/A    Number of Children: N/A  . Years of Education: N/A   Occupational History  . Not on file.   Social History Main Topics  . Smoking status: Former Smoker -- 1.0 packs/day for 44 years    Types: Cigarettes    Quit date: 08/30/1985  . Smokeless tobacco: Never Used  . Alcohol Use: No  .  Drug Use: No  . Sexually Active: Not on file   Other Topics Concern  . Not on file   Social History Narrative  . No narrative on file    Family History: Family History  Problem Relation Age of Onset  . Heart attack Mother   . Aneurysm Mother     AAA    Review of Systems: Positive: Left scrotal swelling and discomfort Negative:   A further 10 point review of systems was negative except what is listed in the HPI.  Physical Exam: @VITALS2 @ General: No acute distress.  Awake. Head:  Normocephalic.  Atraumatic. ENT:  EOMI.  Mucous membranes moist Neck:  Supple.  No lymphadenopathy. CV:  S1 present. S2 present. Regular rate. Pulmonary: Equal effort bilaterally.  Clear to auscultation bilaterally. Abdomen: Soft.  Non- tender to palpation. Skin:  Normal turgor.  No visible rash. Extremity: No gross deformity of bilateral upper  extremities.  No gross deformity of    bilateral lower extremities. Neurologic: Alert. Appropriate mood.  Penis:    No lesions. Urethra:   Orthotopic meatus. Scrotum: No lesions.  No ecchymosis.  No erythema. Testicles: Descended bilaterally.  No masses bilaterally. There is a significant left hydrocele   Studies:  Recent Labs  Basename 07/08/11 1350   HGB 15.8   WBC 6.1   PLT 160    Recent Labs  Basename 07/08/11 1350   NA 137   K 4.4   CL 102   CO2 26   BUN 15   CREATININE 1.02   CALCIUM 9.2   GFRNONAA 67*   GFRAA 78*     No results found for this basename: PT:2,INR:2,APTT:2 in the last 72 hours   No components found with this basename: ABG:2    Assessment:  Large, symptomatic left hydrocele  Plan: Left hydrocelectomy

## 2011-07-11 NOTE — Anesthesia Preprocedure Evaluation (Addendum)
Anesthesia Evaluation  Patient identified by MRN, date of birth, ID band Patient awake    Reviewed: Allergy & Precautions, H&P , NPO status , Patient's Chart, lab work & pertinent test results  Airway Mallampati: II TM Distance: >3 FB Neck ROM: Full    Dental No notable dental hx.    Pulmonary shortness of breath, asthma , COPD COPD inhaler,  Significant COPD. Worsening in the last few months. May soon progress to need for nebs and oxygen. breath sounds clear to auscultation  Pulmonary exam normal       Cardiovascular hypertension, Pt. on home beta blockers + CAD Rhythm:Regular Rate:Normal  .  AAA (abdominal aortic aneurysm)     .  LV dysfunction         EF 55% per echo in 8/12   .  CAD (coronary artery disease)         Prior MI with CABG in 2010   .  Cardiac catheterization  06/17/2008       THERE WAS INCOMPLETE FILLING OF THE VENTRICLE, EF 40% WITH VERY MILD ANTERIOR HYPOKINESIS   .  Coronary artery bypass graft  06/2008       FOR CLASS IV UNSTABLE ANGINA WITH SUBENDOCARDIAL INFARCTION   .  US echocardiography  09/03/2008       EF 40-45%      Neuro/Psych  Neuromuscular disease negative psych ROS   GI/Hepatic negative GI ROS, Neg liver ROS,   Endo/Other  negative endocrine ROS  Renal/GU negative Renal ROS  negative genitourinary   Musculoskeletal negative musculoskeletal ROS (+)   Abdominal (+) + obese,   Peds negative pediatric ROS (+)  Hematology negative hematology ROS (+)   Anesthesia Other Findings   Reproductive/Obstetrics negative OB ROS                          Anesthesia Physical Anesthesia Plan  ASA: III  Anesthesia Plan: Spinal   Post-op Pain Management:    Induction: Intravenous  Airway Management Planned:   Additional Equipment:   Intra-op Plan:   Post-operative Plan: Extubation in OR  Informed Consent: I have reviewed the patients History and Physical, chart,  labs and discussed the procedure including the risks, benefits and alternatives for the proposed anesthesia with the patient or authorized representative who has indicated his/her understanding and acceptance.   Dental advisory given  Plan Discussed with: CRNA  Anesthesia Plan Comments: (Discussed risks/benefits of spinal including headache, backache, failure, bleeding, infection, and nerve damage. Patient consents to spinal. Questions answered. Coagulation studies and platelet count acceptable. Platelets 160K. Patient and patient's daughter agree with spinal)       Anesthesia Quick Evaluation

## 2011-07-13 ENCOUNTER — Encounter (HOSPITAL_COMMUNITY): Payer: Self-pay | Admitting: Urology

## 2011-09-05 ENCOUNTER — Other Ambulatory Visit: Payer: Self-pay | Admitting: *Deleted

## 2011-09-05 MED ORDER — METOPROLOL TARTRATE 25 MG PO TABS: 25.0000 mg | ORAL_TABLET | Freq: Two times a day (BID) | ORAL | Status: DC

## 2012-01-08 ENCOUNTER — Other Ambulatory Visit: Payer: Self-pay | Admitting: Cardiology

## 2012-03-31 ENCOUNTER — Other Ambulatory Visit: Payer: Self-pay | Admitting: Cardiology

## 2012-05-12 ENCOUNTER — Other Ambulatory Visit: Payer: Self-pay | Admitting: Cardiology

## 2012-08-15 ENCOUNTER — Encounter: Payer: Self-pay | Admitting: Cardiology

## 2012-08-15 ENCOUNTER — Ambulatory Visit (INDEPENDENT_AMBULATORY_CARE_PROVIDER_SITE_OTHER): Payer: Self-pay | Admitting: Cardiology

## 2012-08-15 VITALS — BP 104/71 | HR 70 | Ht 68.0 in | Wt 193.8 lb

## 2012-08-15 DIAGNOSIS — I658 Occlusion and stenosis of other precerebral arteries: Secondary | ICD-10-CM

## 2012-08-15 DIAGNOSIS — I6529 Occlusion and stenosis of unspecified carotid artery: Secondary | ICD-10-CM

## 2012-08-15 DIAGNOSIS — I6523 Occlusion and stenosis of bilateral carotid arteries: Secondary | ICD-10-CM

## 2012-08-15 DIAGNOSIS — I714 Abdominal aortic aneurysm, without rupture: Secondary | ICD-10-CM

## 2012-08-15 DIAGNOSIS — I519 Heart disease, unspecified: Secondary | ICD-10-CM

## 2012-08-15 DIAGNOSIS — I2510 Atherosclerotic heart disease of native coronary artery without angina pectoris: Secondary | ICD-10-CM

## 2012-08-15 NOTE — Progress Notes (Signed)
HPI The patient presents for evaluation of CAD.   He is no longer riding his exercise bike.    He does get some dyspnea with exertion but he thinks this is baseline..  The patient denies any new symptoms such as chest discomfort, neck or arm discomfort. There has been no new  PND or orthopnea. There have been no reported palpitations or syncope.  He does have some mild orthostasis. He was more short of breath prior to being started on Breo.  He thinks this has helped quite a bit.  Allergies  Allergen Reactions  . Chlorhexidine Gluconate (Chlorhexidine) Rash    Current Outpatient Prescriptions  Medication Sig Dispense Refill  . aspirin 81 MG tablet Take 81 mg by mouth daily.        Marland Kitchen atorvastatin (LIPITOR) 10 MG tablet Take 10 mg by mouth every evening.      . diclofenac sodium (VOLTAREN) 1 % GEL Apply topically as needed.        Tery Sanfilippo Sodium (DULCOLAX STOOL SOFTENER PO) Take by mouth.      . fish oil-omega-3 fatty acids 1000 MG capsule Take 1,200 mg by mouth 2 (two) times daily.        . Fluticasone Furoate-Vilanterol (BREO ELLIPTA) 100-25 MCG/INH AEPB Inhale into the lungs.      . metoprolol tartrate (LOPRESSOR) 25 MG tablet TAKE 1 TABLET (25 MG TOTAL) BY MOUTH 2 (TWO) TIMES DAILY.  60 tablet  2  . Multiple Vitamin (MULTIVITAMIN) tablet Take 1 tablet by mouth daily.        . naproxen sodium (ANAPROX) 220 MG tablet Take 220 mg by mouth 2 (two) times daily with a meal.      . Pseudoephedrine-Guaifenesin (MUCINEX D PO) Take by mouth daily.       No current facility-administered medications for this visit.    Past Medical History  Diagnosis Date  . COPD (chronic obstructive pulmonary disease)   . Arthritis   . Hemidiaphragm paralysis     RIGHT HEMIDIAPHRAGM  . SOB (shortness of breath)   . OA (osteoarthritis)   . Asthma   . Herpes 02/2008  . H/O: asbestos exposure   . BPH with elevated PSA   . AAA (abdominal aortic aneurysm)   . LV dysfunction     EF 55% per echo in 8/12    . CAD (coronary artery disease)     Prior MI with CABG in 2010    Past Surgical History  Procedure Laterality Date  . Cardiac catheterization  06/17/2008    THERE WAS INCOMPLETE FILLING OF THE VENTRICLE, EF 40% WITH VERY MILD ANTERIOR HYPOKINESIS  . Coronary artery bypass graft  06/2008    FOR CLASS IV UNSTABLE ANGINA WITH SUBENDOCARDIAL INFARCTION  . US echocardiography  09/03/2008    EF 40-45%  . Hernia repair  1954  . Hydrocele excision  07/11/2011    Procedure: HYDROCELECTOMY ADULT;  Surgeon: Marcine Matar, MD;  Location: [facility];  Service: Urology;  Laterality: Left;       ROS:  As stated in the HPI and negative for all other systems.  PHYSICAL EXAM BP 104/71  Pulse 70  Ht 5\' 8"  (1.727 m)  Wt 193 lb 12.8 oz (87.907 kg)  BMI 29.47 kg/m2 GENERAL:  Well appearing HEENT:  Pupils equal round and reactive, fundi not visualized, oral mucosa unremarkable NECK:  No jugular venous distention, waveform within normal limits, carotid upstroke brisk and symmetric, right bruit, no thyromegaly LYMPHATICS:  No  cervical, inguinal adenopathy LUNGS:  Clear to auscultation bilaterally BACK:  No CVA tenderness CHEST:  Well healed sternotomy scar. HEART:  PMI not displaced or sustained,S1 and S2 within normal limits, no S3, no S4, no clicks, no rubs, no murmurs ABD:  Flat, positive bowel sounds normal in frequency in pitch, no bruits, no rebound, no guarding, no midline pulsatile mass, no hepatomegaly, no splenomegaly, obese EXT:  2 plus pulses throughout, no edema, no cyanosis no clubbing SKIN:  No rashes no nodules NEURO:  Cranial nerves II through XII grossly intact, motor grossly intact throughout PSYCH:  Cognitively intact, oriented to person place and time  EKG:  Sinus rhythm, rate 65, low voltage in limb leads, premature ventricular contractions, no acute ST-T wave changes. PVCs   08/15/2012  ASSESSMENT AND PLAN  CAD:  The patient has no new symptoms. I will consider stress testing  next year at the 5 year anniversary of his bypass.  AAA:  He is overdue for followup of this and I will arrange a Doppler.  BRUIT:  He had a 60-79% right stenosis and 0-39% left stenosis last year and he is overdue for followup.

## 2012-08-15 NOTE — Patient Instructions (Addendum)
The current medical regimen is effective;  continue present plan and medications.  Please call [phone] to schedule appointment.  Your physician has requested that you have a carotid duplex. This test is an ultrasound of the carotid arteries in your neck. It looks at blood flow through these arteries that supply the brain with blood. Allow one hour for this exam. There are no restrictions or special instructions.  Your physician has requested that you have an abdominal aorta duplex. During this test, an ultrasound is used to evaluate the aorta. Allow 30 minutes for this exam. Do not eat after midnight the day before and avoid carbonated beverages  Follow up in 1 year with Dr Antoine Poche.  You will receive a letter in the mail 2 months before you are due.  Please call us when you receive this letter to schedule your follow up appointment.

## 2012-09-03 ENCOUNTER — Encounter (INDEPENDENT_AMBULATORY_CARE_PROVIDER_SITE_OTHER): Payer: Medicare Other

## 2012-09-03 DIAGNOSIS — I6529 Occlusion and stenosis of unspecified carotid artery: Secondary | ICD-10-CM

## 2012-09-03 DIAGNOSIS — I714 Abdominal aortic aneurysm, without rupture: Secondary | ICD-10-CM

## 2012-09-03 DIAGNOSIS — I6523 Occlusion and stenosis of bilateral carotid arteries: Secondary | ICD-10-CM

## 2013-05-15 ENCOUNTER — Other Ambulatory Visit (HOSPITAL_COMMUNITY): Payer: Self-pay | Admitting: Cardiology

## 2013-05-15 DIAGNOSIS — I714 Abdominal aortic aneurysm, without rupture, unspecified: Secondary | ICD-10-CM

## 2013-05-17 ENCOUNTER — Ambulatory Visit (HOSPITAL_COMMUNITY): Payer: Medicare Other | Attending: Cardiology | Admitting: *Deleted

## 2013-05-17 ENCOUNTER — Encounter: Payer: Self-pay | Admitting: Cardiology

## 2013-05-17 DIAGNOSIS — I714 Abdominal aortic aneurysm, without rupture, unspecified: Secondary | ICD-10-CM | POA: Insufficient documentation

## 2013-05-17 NOTE — Progress Notes (Signed)
Visceral Abdominal Aorta Duplex complete.

## 2013-06-21 ENCOUNTER — Encounter: Payer: Self-pay | Admitting: *Deleted

## 2014-02-04 ENCOUNTER — Encounter: Payer: Self-pay | Admitting: Cardiology

## 2014-02-04 ENCOUNTER — Ambulatory Visit (INDEPENDENT_AMBULATORY_CARE_PROVIDER_SITE_OTHER): Payer: Medicare Other | Admitting: Cardiology

## 2014-02-04 VITALS — BP 112/80 | HR 77 | Ht 67.5 in | Wt 198.1 lb

## 2014-02-04 DIAGNOSIS — I2510 Atherosclerotic heart disease of native coronary artery without angina pectoris: Secondary | ICD-10-CM

## 2014-02-04 DIAGNOSIS — I714 Abdominal aortic aneurysm, without rupture, unspecified: Secondary | ICD-10-CM

## 2014-02-04 DIAGNOSIS — I6529 Occlusion and stenosis of unspecified carotid artery: Secondary | ICD-10-CM

## 2014-02-04 NOTE — Progress Notes (Signed)
HPI The patient presents for evaluation of CAD.  Since I last saw him he has done well. He gets around slowly with a walker his. However, he denies any overt cardiovascular symptoms. He denies any chest pressure, neck or arm discomfort. He has no palpitations, presyncope or syncope. He doesn't notice any PND or orthopnea. He's had no weight gain or edema. He does get some shortness of breath occasionally at night to take the inhaler and does better with this.  Allergies  Allergen Reactions  . Chlorhexidine Gluconate [Chlorhexidine] Rash    Current Outpatient Prescriptions  Medication Sig Dispense Refill  . aspirin 81 MG tablet Take 81 mg by mouth daily.      Marland Kitchen atorvastatin (LIPITOR) 10 MG tablet Take 10 mg by mouth every evening.    . diclofenac sodium (VOLTAREN) 1 % GEL Apply topically as needed.      Tery Sanfilippo Sodium (DULCOLAX STOOL SOFTENER PO) Take by mouth.    . fish oil-omega-3 fatty acids 1000 MG capsule Take 1,200 mg by mouth 2 (two) times daily.      . Fluticasone Furoate-Vilanterol (BREO ELLIPTA) 100-25 MCG/INH AEPB Inhale into the lungs.    . metoprolol tartrate (LOPRESSOR) 25 MG tablet TAKE 1 TABLET (25 MG TOTAL) BY MOUTH 2 (TWO) TIMES DAILY. 60 tablet 2  . Multiple Vitamin (MULTIVITAMIN) tablet Take 1 tablet by mouth daily.      . naproxen sodium (ANAPROX) 220 MG tablet Take 220 mg by mouth 2 (two) times daily with a meal.    . Pseudoephedrine-Guaifenesin (MUCINEX D PO) Take by mouth daily.    Marland Kitchen tiotropium (SPIRIVA) 18 MCG inhalation capsule Place 18 mcg into inhaler and inhale daily.     No current facility-administered medications for this visit.    Past Medical History  Diagnosis Date  . COPD (chronic obstructive pulmonary disease)   . Arthritis   . Hemidiaphragm paralysis     RIGHT HEMIDIAPHRAGM  . SOB (shortness of breath)   . OA (osteoarthritis)   . Asthma   . Herpes 02/2008  . H/O: asbestos exposure   . BPH with elevated PSA   . AAA (abdominal aortic  aneurysm)   . LV dysfunction     EF 55% per echo in 8/12  . CAD (coronary artery disease)     Prior MI with CABG in 2010    Past Surgical History  Procedure Laterality Date  . Cardiac catheterization  06/17/2008    THERE WAS INCOMPLETE FILLING OF THE VENTRICLE, EF 40% WITH VERY MILD ANTERIOR HYPOKINESIS  . Coronary artery bypass graft  06/2008    FOR CLASS IV UNSTABLE ANGINA WITH SUBENDOCARDIAL INFARCTION  . US echocardiography  09/03/2008    EF 40-45%  . Hernia repair  1954  . Hydrocele excision  07/11/2011    Procedure: HYDROCELECTOMY ADULT;  Surgeon: Marcine Matar, MD;  Location: [facility];  Service: Urology;  Laterality: Left;       ROS:  As stated in the HPI and negative for all other systems.  PHYSICAL EXAM BP 112/80 mmHg  Pulse 77  Ht 5' 7.5" (1.715 m)  Wt 198 lb 1.6 oz (89.858 kg)  BMI 30.55 kg/m2 GENERAL:  Well appearing, but appears his age.  HEENT:  Pupils equal round and reactive, fundi not visualized, oral mucosa unremarkable NECK:  No jugular venous distention, waveform within normal limits, carotid upstroke brisk and symmetric, right bruit, no thyromegaly LYMPHATICS:  No cervical, inguinal adenopathy LUNGS:  Clear to auscultation bilaterally  CHEST:  Well healed sternotomy scar. HEART:  PMI not displaced or sustained,S1 and S2 within normal limits, no S3, no S4, no clicks, no rubs, no murmurs ABD:  Flat, positive bowel sounds normal in frequency in pitch, no bruits, no rebound, no guarding, no midline pulsatile mass, no hepatomegaly, no splenomegaly, obese EXT:  2 plus pulses throughout, no edema, no cyanosis no clubbing   EKG:  Sinus rhythm, rate 77, low voltage in limb leads, premature ventricular contractions, no acute ST-T wave changes. PVCs   02/04/2014  ASSESSMENT AND PLAN  CAD:  The patient has no new symptoms. I will continue medical management.  Stress testing is not indicated.    AAA:   This was 4 x 4 in 2014 and he is overdue for follow up.  I will  arragn this.   BRUIT:  He had a 60-79% right stenosis and 0-39% left stenosis and he is overdue for Doppler.  I will order this.

## 2014-02-04 NOTE — Patient Instructions (Addendum)
Dr Allen KellHochreiin has ordered the following test(s) to be done: 1. Carotid doppler - This test is an ultrasound of the carotid arteries in your neck. It looks at blood flow through these arteries that supply the brain with blood. Allow one hour for this exam. There are no restrictions or special instructions.  2. Abdominal Aortic Aneurysm doppler - During this test, an ultrasound is used to evaluate the aorta. Allow 30 minutes for this exam. Do not eat after midnight the day before and avoid carbonated beverages  Dr Antoine PocheHochrein wants you to follow-up in 1 year. You will receive a reminder letter in the mail one months in advance. If you don't receive a letter, please call our office to schedule the follow-up appointment.

## 2014-02-14 ENCOUNTER — Encounter (HOSPITAL_COMMUNITY): Payer: Medicare Other

## 2014-02-14 ENCOUNTER — Inpatient Hospital Stay (HOSPITAL_COMMUNITY): Admission: RE | Admit: 2014-02-14 | Payer: Medicare Other | Source: Ambulatory Visit

## 2014-02-26 ENCOUNTER — Ambulatory Visit (HOSPITAL_COMMUNITY)
Admission: RE | Admit: 2014-02-26 | Discharge: 2014-02-26 | Disposition: A | Discharge status: Home / Self Care | Payer: Medicare Other | Source: Ambulatory Visit | Attending: Cardiovascular Disease | Admitting: Cardiovascular Disease

## 2014-02-26 ENCOUNTER — Ambulatory Visit (HOSPITAL_BASED_OUTPATIENT_CLINIC_OR_DEPARTMENT_OTHER)
Admission: RE | Admit: 2014-02-26 | Discharge: 2014-02-26 | Disposition: A | Discharge status: Home / Self Care | Payer: Medicare Other | Source: Ambulatory Visit | Attending: Cardiovascular Disease | Admitting: Cardiovascular Disease

## 2014-02-26 DIAGNOSIS — I6521 Occlusion and stenosis of right carotid artery: Secondary | ICD-10-CM

## 2014-02-26 DIAGNOSIS — I714 Abdominal aortic aneurysm, without rupture, unspecified: Secondary | ICD-10-CM

## 2014-02-26 DIAGNOSIS — I6523 Occlusion and stenosis of bilateral carotid arteries: Secondary | ICD-10-CM | POA: Insufficient documentation

## 2014-02-26 DIAGNOSIS — I6529 Occlusion and stenosis of unspecified carotid artery: Secondary | ICD-10-CM

## 2014-02-26 DIAGNOSIS — I700 Atherosclerosis of aorta: Secondary | ICD-10-CM | POA: Insufficient documentation

## 2014-02-26 DIAGNOSIS — I672 Cerebral atherosclerosis: Secondary | ICD-10-CM | POA: Diagnosis present

## 2014-02-26 NOTE — Progress Notes (Signed)
Abdominal Aortic Duplex Completed °Brianna L Mazza,RVT °

## 2014-02-26 NOTE — Progress Notes (Signed)
Carotid Duplex Completed. °Brianna L Mazza,RVT °

## 2014-03-11 ENCOUNTER — Other Ambulatory Visit: Payer: Self-pay | Admitting: *Deleted

## 2014-03-11 DIAGNOSIS — I714 Abdominal aortic aneurysm, without rupture, unspecified: Secondary | ICD-10-CM

## 2014-11-05 ENCOUNTER — Other Ambulatory Visit: Payer: Self-pay | Admitting: Cardiology

## 2014-11-05 DIAGNOSIS — I714 Abdominal aortic aneurysm, without rupture, unspecified: Secondary | ICD-10-CM

## 2014-11-10 ENCOUNTER — Ambulatory Visit (HOSPITAL_COMMUNITY)
Admission: RE | Admit: 2014-11-10 | Discharge: 2014-11-10 | Disposition: A | Discharge status: Home / Self Care | Payer: Medicare Other | Source: Ambulatory Visit | Attending: Cardiology | Admitting: Cardiology

## 2014-11-10 DIAGNOSIS — I2510 Atherosclerotic heart disease of native coronary artery without angina pectoris: Secondary | ICD-10-CM | POA: Diagnosis not present

## 2014-11-10 DIAGNOSIS — I714 Abdominal aortic aneurysm, without rupture, unspecified: Secondary | ICD-10-CM

## 2014-11-10 DIAGNOSIS — I700 Atherosclerosis of aorta: Secondary | ICD-10-CM | POA: Diagnosis not present

## 2014-12-16 ENCOUNTER — Telehealth: Payer: Self-pay | Admitting: *Deleted

## 2014-12-16 DIAGNOSIS — I714 Abdominal aortic aneurysm, without rupture, unspecified: Secondary | ICD-10-CM

## 2014-12-16 NOTE — Telephone Encounter (Signed)
-----   Message from Rollene RotundaJames Hochrein, MD sent at 11/14/2014 12:27 PM EDT ----- Follow up as suggested in six months.  Call Mr. Duffy RhodyStanley with the results and send results to Gaspar GarbeISOVEC,RICHARD W, MD

## 2015-02-15 NOTE — Progress Notes (Signed)
HPI The patient presents for evaluation of CAD.  Since I last saw him he has done well. He gets around slowly with a walking stick.  He denies any cardiovascular symptoms. He denies any chest pressure, neck or arm discomfort. He has no palpitations, presyncope or syncope. He doesn't notice any PND or orthopnea. He's had no weight gain or edema. He does use an inhaler daily.    Allergies  Allergen Reactions  . Chlorhexidine Gluconate [Chlorhexidine] Rash    Current Outpatient Prescriptions  Medication Sig Dispense Refill  . aspirin 81 MG tablet Take 81 mg by mouth daily.      Marland Kitchen atorvastatin (LIPITOR) 10 MG tablet Take 10 mg by mouth every evening.    . diclofenac sodium (VOLTAREN) 1 % GEL Apply topically as needed.      Tery Sanfilippo Sodium (DULCOLAX STOOL SOFTENER PO) Take by mouth.    . fish oil-omega-3 fatty acids 1000 MG capsule Take 1,200 mg by mouth 2 (two) times daily.      . Fluticasone Furoate-Vilanterol (BREO ELLIPTA) 100-25 MCG/INH AEPB Inhale into the lungs.    . metoprolol tartrate (LOPRESSOR) 25 MG tablet TAKE 1 TABLET (25 MG TOTAL) BY MOUTH 2 (TWO) TIMES DAILY. 60 tablet 2  . Multiple Vitamin (MULTIVITAMIN) tablet Take 1 tablet by mouth daily.      . naproxen sodium (ANAPROX) 220 MG tablet Take 220 mg by mouth 2 (two) times daily with a meal.    . Pseudoephedrine-Guaifenesin (MUCINEX D PO) Take by mouth daily.    Marland Kitchen tiotropium (SPIRIVA) 18 MCG inhalation capsule Place 18 mcg into inhaler and inhale daily.     No current facility-administered medications for this visit.    Past Medical History  Diagnosis Date  . COPD (chronic obstructive pulmonary disease) (HCC)   . Arthritis   . Hemidiaphragm paralysis     RIGHT HEMIDIAPHRAGM  . SOB (shortness of breath)   . OA (osteoarthritis)   . Asthma   . Herpes 02/2008  . H/O: asbestos exposure   . BPH with elevated PSA   . AAA (abdominal aortic aneurysm) (HCC)   . LV dysfunction     EF 55% per echo in 8/12  . CAD (coronary  artery disease)     Prior MI with CABG in 2010    Past Surgical History  Procedure Laterality Date  . Cardiac catheterization  06/17/2008    THERE WAS INCOMPLETE FILLING OF THE VENTRICLE, EF 40% WITH VERY MILD ANTERIOR HYPOKINESIS  . Coronary artery bypass graft  06/2008    FOR CLASS IV UNSTABLE ANGINA WITH SUBENDOCARDIAL INFARCTION  . US echocardiography  09/03/2008    EF 40-45%  . Hernia repair  1954  . Hydrocele excision  07/11/2011    Procedure: HYDROCELECTOMY ADULT;  Surgeon: Marcine Matar, MD;  Location: [facility];  Service: Urology;  Laterality: Left;       ROS:  As stated in the HPI and negative for all other systems.  PHYSICAL EXAM BP 108/70 mmHg  Pulse 59  Ht  (1.702 m)  Wt 202 lb (91.627 kg)  BMI 31.63 kg/m2 GENERAL:  Well appearing, but appears his age.  HEENT:  Pupils equal round and reactive, fundi not visualized, oral mucosa unremarkable NECK:  No jugular venous distention, waveform within normal limits, carotid upstroke brisk and symmetric, right bruit, no thyromegaly LYMPHATICS:  No cervical, inguinal adenopathy LUNGS:  Clear to auscultation bilaterally CHEST:  Well healed sternotomy scar. HEART:  PMI not displaced or  sustained,S1 and S2 within normal limits, no S3, no S4, no clicks, no rubs, no murmurs ABD:  Flat, positive bowel sounds normal in frequency in pitch, no bruits, no rebound, no guarding, no midline pulsatile mass, no hepatomegaly, no splenomegaly, obese EXT:  2 plus pulses throughout, no edema, no cyanosis no clubbing   EKG:  Sinus rhythm, rate 59, axis within normal limits, nonspecific diffuse T wave flattening, old anteroseptal infarct   02/16/2015  ASSESSMENT AND PLAN  CAD:  The patient has no new symptoms. I will continue medical management.   AAA:   This was moderate sized in Oct of last year and due for follow up in March  BRUIT:  He had a 60-79% right stenosis and 0-39% left stenosis last Feb.  I will arrange followup in  March

## 2015-02-16 ENCOUNTER — Ambulatory Visit (INDEPENDENT_AMBULATORY_CARE_PROVIDER_SITE_OTHER): Payer: Medicare Other | Admitting: Cardiology

## 2015-02-16 ENCOUNTER — Encounter: Payer: Self-pay | Admitting: Cardiology

## 2015-02-16 VITALS — BP 108/70 | HR 59 | Ht 67.0 in | Wt 202.0 lb

## 2015-02-16 DIAGNOSIS — I2510 Atherosclerotic heart disease of native coronary artery without angina pectoris: Secondary | ICD-10-CM

## 2015-02-16 NOTE — Patient Instructions (Signed)
**  Please schedule your Carotid and AAA Duplex in March on the same day!  Dr Antoine Poche recommends that you schedule a follow-up appointment in 1 year. You will receive a reminder letter in the mail two months in advance. If you don't receive a letter, please call our office to schedule the follow-up appointment.  If you need a refill on your cardiac medications before your next appointment, please call your pharmacy.

## 2015-03-25 DIAGNOSIS — H2511 Age-related nuclear cataract, right eye: Secondary | ICD-10-CM | POA: Diagnosis not present

## 2015-03-25 DIAGNOSIS — H2513 Age-related nuclear cataract, bilateral: Secondary | ICD-10-CM | POA: Diagnosis not present

## 2015-04-07 DIAGNOSIS — Z961 Presence of intraocular lens: Secondary | ICD-10-CM | POA: Diagnosis not present

## 2015-04-07 DIAGNOSIS — H2511 Age-related nuclear cataract, right eye: Secondary | ICD-10-CM | POA: Diagnosis not present

## 2015-04-08 DIAGNOSIS — H2512 Age-related nuclear cataract, left eye: Secondary | ICD-10-CM | POA: Diagnosis not present

## 2015-04-09 ENCOUNTER — Other Ambulatory Visit: Payer: Self-pay | Admitting: Cardiology

## 2015-04-09 DIAGNOSIS — I6523 Occlusion and stenosis of bilateral carotid arteries: Secondary | ICD-10-CM

## 2015-04-14 DIAGNOSIS — Z961 Presence of intraocular lens: Secondary | ICD-10-CM | POA: Diagnosis not present

## 2015-04-14 DIAGNOSIS — H2512 Age-related nuclear cataract, left eye: Secondary | ICD-10-CM | POA: Diagnosis not present

## 2015-04-20 ENCOUNTER — Ambulatory Visit (HOSPITAL_COMMUNITY)
Admission: RE | Admit: 2015-04-20 | Discharge: 2015-04-20 | Disposition: A | Discharge status: Home / Self Care | Payer: Medicare Other | Source: Ambulatory Visit | Attending: Cardiovascular Disease | Admitting: Cardiovascular Disease

## 2015-04-20 ENCOUNTER — Other Ambulatory Visit: Payer: Self-pay | Admitting: Cardiology

## 2015-04-20 DIAGNOSIS — I714 Abdominal aortic aneurysm, without rupture, unspecified: Secondary | ICD-10-CM

## 2015-04-20 DIAGNOSIS — I6523 Occlusion and stenosis of bilateral carotid arteries: Secondary | ICD-10-CM | POA: Insufficient documentation

## 2015-04-20 DIAGNOSIS — I2510 Atherosclerotic heart disease of native coronary artery without angina pectoris: Secondary | ICD-10-CM | POA: Insufficient documentation

## 2015-04-20 DIAGNOSIS — J449 Chronic obstructive pulmonary disease, unspecified: Secondary | ICD-10-CM | POA: Insufficient documentation

## 2015-04-22 ENCOUNTER — Telehealth: Payer: Self-pay | Admitting: *Deleted

## 2015-04-22 DIAGNOSIS — I6523 Occlusion and stenosis of bilateral carotid arteries: Secondary | ICD-10-CM

## 2015-04-22 NOTE — Telephone Encounter (Signed)
Pt aware of Carotid  Carotid was ordered for 1 year

## 2015-04-22 NOTE — Telephone Encounter (Signed)
-----   Message from Rollene RotundaJames Hochrein, MD sent at 04/21/2015  8:30 AM EDT ----- 60 - 79% right stenosis.  Follow up in one year.   Call Mr. Duffy RhodyStanley with the results and send results to Gaspar Garbeichard W Tisovec, MD

## 2015-05-25 ENCOUNTER — Ambulatory Visit (HOSPITAL_COMMUNITY)
Admission: RE | Admit: 2015-05-25 | Discharge: 2015-05-25 | Disposition: A | Discharge status: Home / Self Care | Payer: Medicare Other | Source: Ambulatory Visit | Attending: Cardiology | Admitting: Cardiology

## 2015-05-25 DIAGNOSIS — I708 Atherosclerosis of other arteries: Secondary | ICD-10-CM | POA: Insufficient documentation

## 2015-05-25 DIAGNOSIS — I700 Atherosclerosis of aorta: Secondary | ICD-10-CM | POA: Diagnosis not present

## 2015-05-25 DIAGNOSIS — I714 Abdominal aortic aneurysm, without rupture, unspecified: Secondary | ICD-10-CM

## 2015-05-28 ENCOUNTER — Other Ambulatory Visit: Payer: Self-pay | Admitting: *Deleted

## 2015-05-28 ENCOUNTER — Telehealth: Payer: Self-pay | Admitting: *Deleted

## 2015-05-28 DIAGNOSIS — I714 Abdominal aortic aneurysm, without rupture, unspecified: Secondary | ICD-10-CM

## 2015-05-28 NOTE — Telephone Encounter (Signed)
Spoke with pt daughter ar per DPR about her father doppler, AAA was re-order for 1 year

## 2015-05-28 NOTE — Telephone Encounter (Signed)
-----   Message from Rollene RotundaJames Hochrein, MD sent at 05/26/2015  2:05 PM EDT ----- Follow up in one year. Call Mr. Duffy RhodyStanley with the results and send results to Gaspar Garbeichard W Tisovec, MD

## 2015-07-08 DIAGNOSIS — E7800 Pure hypercholesterolemia, unspecified: Secondary | ICD-10-CM | POA: Diagnosis not present

## 2015-07-08 DIAGNOSIS — Z125 Encounter for screening for malignant neoplasm of prostate: Secondary | ICD-10-CM | POA: Diagnosis not present

## 2015-07-13 DIAGNOSIS — E7800 Pure hypercholesterolemia, unspecified: Secondary | ICD-10-CM | POA: Diagnosis not present

## 2015-07-13 DIAGNOSIS — I119 Hypertensive heart disease without heart failure: Secondary | ICD-10-CM | POA: Diagnosis not present

## 2015-07-13 DIAGNOSIS — E668 Other obesity: Secondary | ICD-10-CM | POA: Diagnosis not present

## 2015-07-13 DIAGNOSIS — F321 Major depressive disorder, single episode, moderate: Secondary | ICD-10-CM | POA: Diagnosis not present

## 2015-07-13 DIAGNOSIS — H259 Unspecified age-related cataract: Secondary | ICD-10-CM | POA: Diagnosis not present

## 2015-07-13 DIAGNOSIS — D692 Other nonthrombocytopenic purpura: Secondary | ICD-10-CM | POA: Diagnosis not present

## 2015-07-13 DIAGNOSIS — Z6831 Body mass index (BMI) 31.0-31.9, adult: Secondary | ICD-10-CM | POA: Diagnosis not present

## 2015-07-13 DIAGNOSIS — I714 Abdominal aortic aneurysm, without rupture: Secondary | ICD-10-CM | POA: Diagnosis not present

## 2015-07-13 DIAGNOSIS — N401 Enlarged prostate with lower urinary tract symptoms: Secondary | ICD-10-CM | POA: Diagnosis not present

## 2015-07-13 DIAGNOSIS — Z0000 Encounter for general adult medical examination without abnormal findings: Secondary | ICD-10-CM | POA: Diagnosis not present

## 2016-06-25 DIAGNOSIS — J449 Chronic obstructive pulmonary disease, unspecified: Secondary | ICD-10-CM | POA: Diagnosis not present

## 2016-06-25 DIAGNOSIS — K5900 Constipation, unspecified: Secondary | ICD-10-CM | POA: Diagnosis not present

## 2016-06-25 DIAGNOSIS — K219 Gastro-esophageal reflux disease without esophagitis: Secondary | ICD-10-CM | POA: Diagnosis not present

## 2016-06-25 DIAGNOSIS — I2510 Atherosclerotic heart disease of native coronary artery without angina pectoris: Secondary | ICD-10-CM | POA: Diagnosis not present

## 2016-07-07 ENCOUNTER — Ambulatory Visit (HOSPITAL_COMMUNITY)
Admission: RE | Admit: 2016-07-07 | Discharge: 2016-07-07 | Disposition: A | Discharge status: Home / Self Care | Payer: Medicare Other | Source: Ambulatory Visit | Attending: Cardiology | Admitting: Cardiology

## 2016-07-07 DIAGNOSIS — I708 Atherosclerosis of other arteries: Secondary | ICD-10-CM | POA: Diagnosis not present

## 2016-07-07 DIAGNOSIS — J449 Chronic obstructive pulmonary disease, unspecified: Secondary | ICD-10-CM | POA: Insufficient documentation

## 2016-07-07 DIAGNOSIS — Z87891 Personal history of nicotine dependence: Secondary | ICD-10-CM | POA: Insufficient documentation

## 2016-07-07 DIAGNOSIS — I700 Atherosclerosis of aorta: Secondary | ICD-10-CM | POA: Diagnosis not present

## 2016-07-07 DIAGNOSIS — I714 Abdominal aortic aneurysm, without rupture, unspecified: Secondary | ICD-10-CM

## 2016-07-07 DIAGNOSIS — I2510 Atherosclerotic heart disease of native coronary artery without angina pectoris: Secondary | ICD-10-CM | POA: Diagnosis not present

## 2016-07-07 DIAGNOSIS — I723 Aneurysm of iliac artery: Secondary | ICD-10-CM | POA: Diagnosis not present

## 2016-07-11 DIAGNOSIS — Z125 Encounter for screening for malignant neoplasm of prostate: Secondary | ICD-10-CM | POA: Diagnosis not present

## 2016-07-11 DIAGNOSIS — E7800 Pure hypercholesterolemia, unspecified: Secondary | ICD-10-CM | POA: Diagnosis not present

## 2016-07-15 DIAGNOSIS — I119 Hypertensive heart disease without heart failure: Secondary | ICD-10-CM | POA: Diagnosis not present

## 2016-07-15 DIAGNOSIS — I714 Abdominal aortic aneurysm, without rupture: Secondary | ICD-10-CM | POA: Diagnosis not present

## 2016-07-15 DIAGNOSIS — D692 Other nonthrombocytopenic purpura: Secondary | ICD-10-CM | POA: Diagnosis not present

## 2016-07-15 DIAGNOSIS — Z0000 Encounter for general adult medical examination without abnormal findings: Secondary | ICD-10-CM | POA: Diagnosis not present

## 2017-07-12 DIAGNOSIS — I10 Essential (primary) hypertension: Secondary | ICD-10-CM | POA: Diagnosis not present

## 2017-07-12 DIAGNOSIS — E7800 Pure hypercholesterolemia, unspecified: Secondary | ICD-10-CM | POA: Diagnosis not present

## 2017-07-12 DIAGNOSIS — Z125 Encounter for screening for malignant neoplasm of prostate: Secondary | ICD-10-CM | POA: Diagnosis not present

## 2017-07-24 DIAGNOSIS — Z0000 Encounter for general adult medical examination without abnormal findings: Secondary | ICD-10-CM | POA: Diagnosis not present

## 2017-07-24 DIAGNOSIS — I2510 Atherosclerotic heart disease of native coronary artery without angina pectoris: Secondary | ICD-10-CM | POA: Diagnosis not present

## 2017-07-24 DIAGNOSIS — J449 Chronic obstructive pulmonary disease, unspecified: Secondary | ICD-10-CM | POA: Diagnosis not present

## 2017-07-24 DIAGNOSIS — R82998 Other abnormal findings in urine: Secondary | ICD-10-CM | POA: Diagnosis not present

## 2017-07-24 DIAGNOSIS — I119 Hypertensive heart disease without heart failure: Secondary | ICD-10-CM | POA: Diagnosis not present

## 2018-07-23 DIAGNOSIS — I2510 Atherosclerotic heart disease of native coronary artery without angina pectoris: Secondary | ICD-10-CM | POA: Diagnosis not present

## 2018-07-23 DIAGNOSIS — Z125 Encounter for screening for malignant neoplasm of prostate: Secondary | ICD-10-CM | POA: Diagnosis not present

## 2018-07-23 DIAGNOSIS — R972 Elevated prostate specific antigen [PSA]: Secondary | ICD-10-CM | POA: Diagnosis not present

## 2018-07-23 DIAGNOSIS — I119 Hypertensive heart disease without heart failure: Secondary | ICD-10-CM | POA: Diagnosis not present

## 2018-07-23 DIAGNOSIS — E7800 Pure hypercholesterolemia, unspecified: Secondary | ICD-10-CM | POA: Diagnosis not present

## 2018-07-30 DIAGNOSIS — Z0000 Encounter for general adult medical examination without abnormal findings: Secondary | ICD-10-CM | POA: Diagnosis not present

## 2018-07-30 DIAGNOSIS — I119 Hypertensive heart disease without heart failure: Secondary | ICD-10-CM | POA: Diagnosis not present

## 2018-07-30 DIAGNOSIS — I2510 Atherosclerotic heart disease of native coronary artery without angina pectoris: Secondary | ICD-10-CM | POA: Diagnosis not present

## 2018-07-30 DIAGNOSIS — J449 Chronic obstructive pulmonary disease, unspecified: Secondary | ICD-10-CM | POA: Diagnosis not present

## 2019-01-01 ENCOUNTER — Encounter (HOSPITAL_COMMUNITY): Payer: Self-pay | Admitting: *Deleted

## 2019-01-01 ENCOUNTER — Emergency Department (HOSPITAL_COMMUNITY): Payer: Medicare Other

## 2019-01-01 ENCOUNTER — Inpatient Hospital Stay (HOSPITAL_COMMUNITY)
Admission: EM | Admit: 2019-01-01 | Discharge: 2019-01-25 | DRG: 177 | Disposition: E | Payer: Medicare Other | Attending: Pulmonary Disease | Admitting: Pulmonary Disease

## 2019-01-01 DIAGNOSIS — R414 Neurologic neglect syndrome: Secondary | ICD-10-CM | POA: Diagnosis not present

## 2019-01-01 DIAGNOSIS — I429 Cardiomyopathy, unspecified: Secondary | ICD-10-CM | POA: Diagnosis present

## 2019-01-01 DIAGNOSIS — Z66 Do not resuscitate: Secondary | ICD-10-CM | POA: Diagnosis not present

## 2019-01-01 DIAGNOSIS — R404 Transient alteration of awareness: Secondary | ICD-10-CM | POA: Diagnosis not present

## 2019-01-01 DIAGNOSIS — J441 Chronic obstructive pulmonary disease with (acute) exacerbation: Secondary | ICD-10-CM | POA: Diagnosis not present

## 2019-01-01 DIAGNOSIS — I252 Old myocardial infarction: Secondary | ICD-10-CM | POA: Diagnosis not present

## 2019-01-01 DIAGNOSIS — R64 Cachexia: Secondary | ICD-10-CM | POA: Diagnosis not present

## 2019-01-01 DIAGNOSIS — Z888 Allergy status to other drugs, medicaments and biological substances status: Secondary | ICD-10-CM

## 2019-01-01 DIAGNOSIS — E46 Unspecified protein-calorie malnutrition: Secondary | ICD-10-CM | POA: Diagnosis not present

## 2019-01-01 DIAGNOSIS — I472 Ventricular tachycardia: Secondary | ICD-10-CM | POA: Diagnosis not present

## 2019-01-01 DIAGNOSIS — J1289 Other viral pneumonia: Secondary | ICD-10-CM | POA: Diagnosis present

## 2019-01-01 DIAGNOSIS — D72828 Other elevated white blood cell count: Secondary | ICD-10-CM | POA: Diagnosis not present

## 2019-01-01 DIAGNOSIS — U071 COVID-19: Secondary | ICD-10-CM | POA: Diagnosis not present

## 2019-01-01 DIAGNOSIS — R34 Anuria and oliguria: Secondary | ICD-10-CM | POA: Diagnosis not present

## 2019-01-01 DIAGNOSIS — R29810 Facial weakness: Secondary | ICD-10-CM | POA: Diagnosis not present

## 2019-01-01 DIAGNOSIS — I2510 Atherosclerotic heart disease of native coronary artery without angina pectoris: Secondary | ICD-10-CM | POA: Diagnosis not present

## 2019-01-01 DIAGNOSIS — N179 Acute kidney failure, unspecified: Secondary | ICD-10-CM | POA: Diagnosis not present

## 2019-01-01 DIAGNOSIS — R4701 Aphasia: Secondary | ICD-10-CM | POA: Diagnosis not present

## 2019-01-01 DIAGNOSIS — J9601 Acute respiratory failure with hypoxia: Secondary | ICD-10-CM | POA: Diagnosis present

## 2019-01-01 DIAGNOSIS — Z77090 Contact with and (suspected) exposure to asbestos: Secondary | ICD-10-CM | POA: Diagnosis present

## 2019-01-01 DIAGNOSIS — J9611 Chronic respiratory failure with hypoxia: Secondary | ICD-10-CM | POA: Diagnosis not present

## 2019-01-01 DIAGNOSIS — R8281 Pyuria: Secondary | ICD-10-CM | POA: Diagnosis present

## 2019-01-01 DIAGNOSIS — T380X5A Adverse effect of glucocorticoids and synthetic analogues, initial encounter: Secondary | ICD-10-CM | POA: Diagnosis not present

## 2019-01-01 DIAGNOSIS — G931 Anoxic brain damage, not elsewhere classified: Secondary | ICD-10-CM | POA: Diagnosis not present

## 2019-01-01 DIAGNOSIS — I63311 Cerebral infarction due to thrombosis of right middle cerebral artery: Secondary | ICD-10-CM | POA: Diagnosis not present

## 2019-01-01 DIAGNOSIS — Z515 Encounter for palliative care: Secondary | ICD-10-CM | POA: Diagnosis not present

## 2019-01-01 DIAGNOSIS — M1990 Unspecified osteoarthritis, unspecified site: Secondary | ICD-10-CM | POA: Diagnosis present

## 2019-01-01 DIAGNOSIS — G8194 Hemiplegia, unspecified affecting left nondominant side: Secondary | ICD-10-CM | POA: Diagnosis not present

## 2019-01-01 DIAGNOSIS — I6330 Cerebral infarction due to thrombosis of unspecified cerebral artery: Secondary | ICD-10-CM | POA: Insufficient documentation

## 2019-01-01 DIAGNOSIS — I480 Paroxysmal atrial fibrillation: Secondary | ICD-10-CM | POA: Diagnosis present

## 2019-01-01 DIAGNOSIS — F05 Delirium due to known physiological condition: Secondary | ICD-10-CM | POA: Diagnosis not present

## 2019-01-01 DIAGNOSIS — Z79899 Other long term (current) drug therapy: Secondary | ICD-10-CM

## 2019-01-01 DIAGNOSIS — D696 Thrombocytopenia, unspecified: Secondary | ICD-10-CM | POA: Diagnosis not present

## 2019-01-01 DIAGNOSIS — I639 Cerebral infarction, unspecified: Secondary | ICD-10-CM | POA: Diagnosis not present

## 2019-01-01 DIAGNOSIS — Z87891 Personal history of nicotine dependence: Secondary | ICD-10-CM

## 2019-01-01 DIAGNOSIS — I714 Abdominal aortic aneurysm, without rupture: Secondary | ICD-10-CM | POA: Diagnosis present

## 2019-01-01 DIAGNOSIS — Z6821 Body mass index (BMI) 21.0-21.9, adult: Secondary | ICD-10-CM

## 2019-01-01 DIAGNOSIS — N170 Acute kidney failure with tubular necrosis: Secondary | ICD-10-CM | POA: Diagnosis not present

## 2019-01-01 DIAGNOSIS — J440 Chronic obstructive pulmonary disease with acute lower respiratory infection: Secondary | ICD-10-CM | POA: Diagnosis present

## 2019-01-01 DIAGNOSIS — I4891 Unspecified atrial fibrillation: Secondary | ICD-10-CM | POA: Diagnosis not present

## 2019-01-01 DIAGNOSIS — Z951 Presence of aortocoronary bypass graft: Secondary | ICD-10-CM

## 2019-01-01 DIAGNOSIS — E785 Hyperlipidemia, unspecified: Secondary | ICD-10-CM | POA: Diagnosis present

## 2019-01-01 DIAGNOSIS — I10 Essential (primary) hypertension: Secondary | ICD-10-CM | POA: Diagnosis present

## 2019-01-01 DIAGNOSIS — J9600 Acute respiratory failure, unspecified whether with hypoxia or hypercapnia: Secondary | ICD-10-CM | POA: Diagnosis not present

## 2019-01-01 DIAGNOSIS — G9349 Other encephalopathy: Secondary | ICD-10-CM | POA: Diagnosis not present

## 2019-01-01 DIAGNOSIS — Z8249 Family history of ischemic heart disease and other diseases of the circulatory system: Secondary | ICD-10-CM

## 2019-01-01 DIAGNOSIS — J1282 Pneumonia due to coronavirus disease 2019: Secondary | ICD-10-CM

## 2019-01-01 DIAGNOSIS — Z7982 Long term (current) use of aspirin: Secondary | ICD-10-CM

## 2019-01-01 DIAGNOSIS — H518 Other specified disorders of binocular movement: Secondary | ICD-10-CM | POA: Diagnosis not present

## 2019-01-01 DIAGNOSIS — R531 Weakness: Secondary | ICD-10-CM | POA: Diagnosis not present

## 2019-01-01 DIAGNOSIS — Z791 Long term (current) use of non-steroidal anti-inflammatories (NSAID): Secondary | ICD-10-CM

## 2019-01-01 LAB — COMPREHENSIVE METABOLIC PANEL
ALT: 21 U/L (ref 0–44)
AST: 58 U/L — ABNORMAL HIGH (ref 15–41)
Albumin: 3.6 g/dL (ref 3.5–5.0)
Alkaline Phosphatase: 60 U/L (ref 38–126)
Anion gap: 11 (ref 5–15)
BUN: 29 mg/dL — ABNORMAL HIGH (ref 8–23)
CO2: 23 mmol/L (ref 22–32)
Calcium: 8.2 mg/dL — ABNORMAL LOW (ref 8.9–10.3)
Chloride: 105 mmol/L (ref 98–111)
Creatinine, Ser: 1.06 mg/dL (ref 0.61–1.24)
GFR calc Af Amer: >60 mL/min (ref >60)
GFR calc non Af Amer: >60 mL/min (ref >60)
Glucose, Bld: 94 mg/dL (ref 70–99)
Potassium: 3.9 mmol/L (ref 3.5–5.1)
Sodium: 139 mmol/L (ref 135–145)
Total Bilirubin: 1.2 mg/dL (ref 0.3–1.2)
Total Protein: 6.9 g/dL (ref 6.5–8.1)

## 2019-01-01 LAB — CBC WITH DIFFERENTIAL/PLATELET
Abs Immature Granulocytes: 0.02 10*3/uL (ref 0.00–0.07)
Basophils Absolute: 0 10*3/uL (ref 0.0–0.1)
Basophils Relative: 0 %
Eosinophils Absolute: 0 10*3/uL (ref 0.0–0.5)
Eosinophils Relative: 0 %
HCT: 48.2 % (ref 39.0–52.0)
Hemoglobin: 15.3 g/dL (ref 13.0–17.0)
Immature Granulocytes: 0 %
Lymphocytes Relative: 21 %
Lymphs Abs: 1.2 10*3/uL (ref 0.7–4.0)
MCH: 28.5 pg (ref 26.0–34.0)
MCHC: 31.7 g/dL (ref 30.0–36.0)
MCV: 89.8 fL (ref 80.0–100.0)
Monocytes Absolute: 0.6 10*3/uL (ref 0.1–1.0)
Monocytes Relative: 11 %
Neutro Abs: 3.7 10*3/uL (ref 1.7–7.7)
Neutrophils Relative %: 68 %
Platelets: 124 10*3/uL — ABNORMAL LOW (ref 150–400)
RBC: 5.37 MIL/uL (ref 4.22–5.81)
RDW: 14.4 % (ref 11.5–15.5)
WBC: 5.6 10*3/uL (ref 4.0–10.5)
nRBC: 0 % (ref 0.0–0.2)

## 2019-01-01 LAB — URINALYSIS, ROUTINE W REFLEX MICROSCOPIC
Bilirubin Urine: NEGATIVE
Glucose, UA: NEGATIVE mg/dL
Ketones, ur: 5 mg/dL — AB
Leukocytes,Ua: NEGATIVE
Nitrite: NEGATIVE
Protein, ur: 100 mg/dL — AB
Specific Gravity, Urine: 1.025 (ref 1.005–1.030)
pH: 5 (ref 5.0–8.0)

## 2019-01-01 LAB — LACTATE DEHYDROGENASE: LDH: 381 U/L — ABNORMAL HIGH (ref 98–192)

## 2019-01-01 LAB — PROCALCITONIN: Procalcitonin: <0.1 ng/mL

## 2019-01-01 LAB — D-DIMER, QUANTITATIVE: D-Dimer, Quant: 1.85 ug/mL-FEU — ABNORMAL HIGH (ref 0.00–0.50)

## 2019-01-01 LAB — FERRITIN: Ferritin: 1469 ng/mL — ABNORMAL HIGH (ref 24–336)

## 2019-01-01 LAB — POC SARS CORONAVIRUS 2 AG -  ED: SARS Coronavirus 2 Ag: POSITIVE — AB

## 2019-01-01 LAB — FIBRINOGEN: Fibrinogen: 479 mg/dL — ABNORMAL HIGH (ref 210–475)

## 2019-01-01 LAB — TRIGLYCERIDES: Triglycerides: 81 mg/dL (ref <150)

## 2019-01-01 LAB — C-REACTIVE PROTEIN: CRP: 9.3 mg/dL — ABNORMAL HIGH (ref <1.0)

## 2019-01-01 LAB — TSH: TSH: 1.745 u[IU]/mL (ref 0.350–4.500)

## 2019-01-01 LAB — LACTIC ACID, PLASMA: Lactic Acid, Venous: 1.7 mmol/L (ref 0.5–1.9)

## 2019-01-01 LAB — STREP PNEUMONIAE URINARY ANTIGEN: Strep Pneumo Urinary Antigen: NEGATIVE

## 2019-01-01 MED ORDER — SODIUM CHLORIDE 0.9 % IV SOLN: 200.0000 mg | Freq: Once | INTRAVENOUS | Status: DC

## 2019-01-01 MED ORDER — OMEGA-3 FATTY ACIDS 1000 MG PO CAPS: 1200.0000 mg | ORAL_CAPSULE | Freq: Two times a day (BID) | ORAL | Status: DC

## 2019-01-01 MED ORDER — SODIUM CHLORIDE 0.9 % IV SOLN
2.0000 g | Freq: Once | INTRAVENOUS | Status: AC
  Administered 2019-01-01: 2 g via INTRAVENOUS
  Filled 2019-01-01: qty 2

## 2019-01-01 MED ORDER — FLUTICASONE FUROATE-VILANTEROL 100-25 MCG/INH IN AEPB: 1.0000 | INHALATION_SPRAY | Freq: Every day | RESPIRATORY_TRACT | Status: DC

## 2019-01-01 MED ORDER — SODIUM CHLORIDE 0.9 % IV SOLN
200.0000 mg | Freq: Once | INTRAVENOUS | Status: AC
  Administered 2019-01-01: 200 mg via INTRAVENOUS
  Filled 2019-01-01: qty 200

## 2019-01-01 MED ORDER — METRONIDAZOLE IN NACL 5-0.79 MG/ML-% IV SOLN
500.0000 mg | Freq: Three times a day (TID) | INTRAVENOUS | Status: DC
  Administered 2019-01-01: 500 mg via INTRAVENOUS
  Filled 2019-01-01: qty 100

## 2019-01-01 MED ORDER — ATORVASTATIN CALCIUM 10 MG PO TABS
20.0000 mg | ORAL_TABLET | Freq: Every day | ORAL | Status: DC
  Administered 2019-01-02 – 2019-01-03 (×2): 20 mg via ORAL
  Filled 2019-01-01 (×2): qty 2

## 2019-01-01 MED ORDER — ACETAMINOPHEN 325 MG PO TABS: 650.0000 mg | ORAL_TABLET | Freq: Four times a day (QID) | ORAL | Status: DC | PRN

## 2019-01-01 MED ORDER — VANCOMYCIN HCL 10 G IV SOLR: 1250.0000 mg | Freq: Two times a day (BID) | INTRAVENOUS | Status: DC

## 2019-01-01 MED ORDER — DICLOFENAC SODIUM 1 % TD GEL
2.0000 g | Freq: Three times a day (TID) | TRANSDERMAL | Status: DC | PRN
  Filled 2019-01-01: qty 100

## 2019-01-01 MED ORDER — SODIUM CHLORIDE 0.9 % IV SOLN
100.0000 mg | Freq: Every day | INTRAVENOUS | Status: AC
  Administered 2019-01-02 – 2019-01-05 (×4): 100 mg via INTRAVENOUS
  Filled 2019-01-01 (×2): qty 100
  Filled 2019-01-01 (×2): qty 20

## 2019-01-01 MED ORDER — ONDANSETRON HCL 4 MG PO TABS: 4.0000 mg | ORAL_TABLET | Freq: Four times a day (QID) | ORAL | Status: DC | PRN

## 2019-01-01 MED ORDER — VITAMIN C 500 MG PO TABS
500.0000 mg | ORAL_TABLET | Freq: Every day | ORAL | Status: DC
  Administered 2019-01-02 – 2019-01-03 (×3): 500 mg via ORAL
  Filled 2019-01-01 (×3): qty 1

## 2019-01-01 MED ORDER — UMECLIDINIUM BROMIDE 62.5 MCG/INH IN AEPB
1.0000 | INHALATION_SPRAY | Freq: Every day | RESPIRATORY_TRACT | Status: DC
  Administered 2019-01-02 – 2019-01-04 (×3): 1 via RESPIRATORY_TRACT
  Filled 2019-01-01 (×2): qty 7

## 2019-01-01 MED ORDER — VANCOMYCIN HCL 10 G IV SOLR: 1250.0000 mg | INTRAVENOUS | Status: DC

## 2019-01-01 MED ORDER — VANCOMYCIN HCL 10 G IV SOLR
1500.0000 mg | Freq: Once | INTRAVENOUS | Status: DC
  Filled 2019-01-01: qty 1500

## 2019-01-01 MED ORDER — SODIUM CHLORIDE 0.9 % IV SOLN: 2.0000 g | Freq: Two times a day (BID) | INTRAVENOUS | Status: DC

## 2019-01-01 MED ORDER — OMEGA-3-ACID ETHYL ESTERS 1 G PO CAPS
1.0000 g | ORAL_CAPSULE | Freq: Two times a day (BID) | ORAL | Status: DC
  Administered 2019-01-02 – 2019-01-03 (×3): 1 g via ORAL
  Filled 2019-01-01 (×4): qty 1

## 2019-01-01 MED ORDER — TIOTROPIUM BROMIDE MONOHYDRATE 18 MCG IN CAPS: 18.0000 ug | ORAL_CAPSULE | Freq: Every day | RESPIRATORY_TRACT | Status: DC

## 2019-01-01 MED ORDER — ALBUTEROL SULFATE HFA 108 (90 BASE) MCG/ACT IN AERS
2.0000 | INHALATION_SPRAY | Freq: Four times a day (QID) | RESPIRATORY_TRACT | Status: DC
  Administered 2019-01-02 – 2019-01-06 (×14): 2 via RESPIRATORY_TRACT
  Filled 2019-01-01: qty 6.7

## 2019-01-01 MED ORDER — ENOXAPARIN SODIUM 40 MG/0.4ML ~~LOC~~ SOLN
40.0000 mg | SUBCUTANEOUS | Status: DC
  Administered 2019-01-02 – 2019-01-03 (×2): 40 mg via SUBCUTANEOUS
  Filled 2019-01-01: qty 0.4

## 2019-01-01 MED ORDER — SODIUM CHLORIDE 0.9 % IV SOLN
1.0000 g | INTRAVENOUS | Status: DC
  Administered 2019-01-02 – 2019-01-05 (×4): 1 g via INTRAVENOUS
  Filled 2019-01-01: qty 10
  Filled 2019-01-01 (×2): qty 1
  Filled 2019-01-01 (×2): qty 10
  Filled 2019-01-01: qty 1

## 2019-01-01 MED ORDER — FLUTICASONE FUROATE-VILANTEROL 100-25 MCG/INH IN AEPB
1.0000 | INHALATION_SPRAY | Freq: Every day | RESPIRATORY_TRACT | Status: DC
  Administered 2019-01-02 – 2019-01-04 (×3): 1 via RESPIRATORY_TRACT
  Filled 2019-01-01 (×2): qty 28

## 2019-01-01 MED ORDER — SODIUM CHLORIDE 0.9 % IV SOLN: 100.0000 mg | Freq: Every day | INTRAVENOUS | Status: DC

## 2019-01-01 MED ORDER — ONDANSETRON HCL 4 MG/2ML IJ SOLN
4.0000 mg | Freq: Four times a day (QID) | INTRAMUSCULAR | Status: DC | PRN
  Administered 2019-01-03: 4 mg via INTRAVENOUS
  Filled 2019-01-01: qty 2

## 2019-01-01 MED ORDER — ADULT MULTIVITAMIN W/MINERALS CH
1.0000 | ORAL_TABLET | Freq: Every day | ORAL | Status: DC
  Administered 2019-01-02 – 2019-01-03 (×2): 1 via ORAL
  Filled 2019-01-01 (×4): qty 1

## 2019-01-01 MED ORDER — METOPROLOL TARTRATE 25 MG PO TABS
25.0000 mg | ORAL_TABLET | Freq: Two times a day (BID) | ORAL | Status: DC
  Administered 2019-01-02 – 2019-01-03 (×3): 25 mg via ORAL
  Filled 2019-01-01 (×5): qty 1

## 2019-01-01 MED ORDER — FLUTICASONE-UMECLIDIN-VILANT 100-62.5-25 MCG/INH IN AEPB: 1.0000 | INHALATION_SPRAY | Freq: Every day | RESPIRATORY_TRACT | Status: DC

## 2019-01-01 MED ORDER — ASPIRIN EC 81 MG PO TBEC
81.0000 mg | DELAYED_RELEASE_TABLET | Freq: Every day | ORAL | Status: DC
  Administered 2019-01-02 – 2019-01-03 (×2): 81 mg via ORAL
  Filled 2019-01-01 (×2): qty 1

## 2019-01-01 MED ORDER — DEXAMETHASONE SODIUM PHOSPHATE 10 MG/ML IJ SOLN
10.0000 mg | Freq: Once | INTRAMUSCULAR | Status: AC
  Administered 2019-01-01: 10 mg via INTRAVENOUS
  Filled 2019-01-01: qty 1

## 2019-01-01 MED ORDER — ZINC SULFATE 220 (50 ZN) MG PO CAPS
220.0000 mg | ORAL_CAPSULE | Freq: Every day | ORAL | Status: DC
  Administered 2019-01-02 – 2019-01-03 (×3): 220 mg via ORAL
  Filled 2019-01-01 (×3): qty 1

## 2019-01-01 MED ORDER — DEXAMETHASONE SODIUM PHOSPHATE 10 MG/ML IJ SOLN
6.0000 mg | INTRAMUSCULAR | Status: DC
  Administered 2019-01-02 – 2019-01-05 (×4): 6 mg via INTRAVENOUS
  Filled 2019-01-01 (×4): qty 1

## 2019-01-01 NOTE — ED Triage Notes (Signed)
Per EMS, pt from home complaining of generalized weakness x 3 days. Pt has had productive cough and foul smelling urine. Pt was pale and cyanotic on room air. Pt's O2 saturation was 68% on RA upon arrival, pt was placed on non rebreather and reached 95%. Pt is alert and oriented x 2, is normally oriented x 4. Pt was hypotensive for EMS, received 1500 cc NS en route.   BP 90/50, 130/70 after 1500 cc NS HR 70-100, Afib RR 32 Temp 98.1 CBG 141  EtCO2 18

## 2019-01-01 NOTE — Progress Notes (Signed)
Pharmacy Antibiotic Note  Jermaine Wright is a 85 y.o. male presented to the ED on 22-Jan-2019 with c/o weakness, cough and foul smelling urine.  Pharmacy has been consulted to dose vancomycin and cefepime for suspected sepsis.  Plan: - vancomycin 1500 mg IV x1, then 1250 mg IV q24h for est AUC 517 - cefepime 2gm IV q12h ___________________________________________   Temp (24hrs), Avg:98.7 F (37.1 C), Min:97.8 F (36.6 C), Max:99.5 F (37.5 C)  Recent Labs  Lab Jan 22, 2019 1148  WBC 5.6  CREATININE 1.06  LATICACIDVEN 1.7    CrCl cannot be calculated (Unknown ideal weight.).    Allergies  Allergen Reactions  . Chlorhexidine Gluconate [Chlorhexidine] Rash     Thank you for allowing pharmacy to be a part of this patient's care.  Lynelle Doctor 2019-01-22 1:28 PM

## 2019-01-01 NOTE — H&P (Addendum)
History and Physical    Jermaine SchleinJohn B Wright ZOX:[id number] DOB: 1928/10/16 DOA: 01/15/2019  PCP: Gaspar Garbeisovec, Richard W, MD  Patient coming from: home  I have personally briefly reviewed [facility]  Chief Complaint: Runell GessWeakness/SOB  HPI: Jermaine SchleinJohn B Wright is a 85 y.o. male with medical history significant of CAD s/p CABG, COPD, BPH, R. hemidaphram paralysis, Osteoarthritis who presents from home with generalized weakness.  Patient per the family did not start feeling well since Friday, and on Saturday he just spent most of the day in bed.  At home, they did not notice any fevers but they report he has a chronic cough where he was clearing his throat.  She states he was just profoundly weak.  She states he had reported to them he was constipated.  She states his inability to sit up, inability to walk and do things on his own which prompted them to call for ambulance.  She reports he is normally very sharp despite his age.  Patient per daughter's recollection has never had hx of Atrial Fibrillation.  No smoking, alcohol or drugs At baseline, he carries conversation well, ambulates with a cane  Review of Systems: As per HPI otherwise 10 point review of systems negative.    Past Medical History:  Diagnosis Date  . AAA (abdominal aortic aneurysm) (HCC)   . Arthritis   . Asthma   . BPH with elevated PSA   . CAD (coronary artery disease)    Prior MI with CABG in 2010  . COPD (chronic obstructive pulmonary disease) (HCC)   . H/O: asbestos exposure   . Hemidiaphragm paralysis    RIGHT HEMIDIAPHRAGM  . Herpes 02/2008  . LV dysfunction    EF 55% per echo in 8/12  . OA (osteoarthritis)   . SOB (shortness of breath)     Past Surgical History:  Procedure Laterality Date  . CARDIAC CATHETERIZATION  06/17/2008   THERE WAS INCOMPLETE FILLING OF THE VENTRICLE, EF 40% WITH VERY MILD ANTERIOR HYPOKINESIS  . CORONARY ARTERY BYPASS GRAFT  06/2008   FOR CLASS IV UNSTABLE  ANGINA WITH SUBENDOCARDIAL INFARCTION  . HERNIA REPAIR  1954  . HYDROCELE EXCISION  07/11/2011   Procedure: HYDROCELECTOMY ADULT;  Surgeon: Marcine MatarStephen Dahlstedt, MD;  Location: [facility];  Service: Urology;  Laterality: Left;     . US ECHOCARDIOGRAPHY  09/03/2008   EF 40-45%     reports that he quit smoking about 33 years ago. His smoking use included cigarettes. He has a 44.00 pack-year smoking history. He has never used smokeless tobacco. He reports that he does not drink alcohol or use drugs.  Allergies  Allergen Reactions  . Chlorhexidine Gluconate [Chlorhexidine] Rash    Family History  Problem Relation Age of Onset  . Heart attack Mother   . Aneurysm Mother        AAA     Prior to Admission medications   Medication Sig Start Date End Date Taking? Authorizing Provider  aspirin 81 MG tablet Take 81 mg by mouth daily.      [provider]  atorvastatin (LIPITOR) 10 MG tablet Take 10 mg by mouth every evening. 07/05/11   Rollene RotundaHochrein, James, MD  atorvastatin (LIPITOR) 20 MG tablet Take 20 mg by mouth daily. 12/12/18   [provider]  diclofenac sodium (VOLTAREN) 1 % GEL Apply topically as needed.      [provider]  Docusate Sodium (DULCOLAX STOOL SOFTENER PO) Take by mouth.  [provider]  fish oil-omega-3 fatty acids 1000 MG capsule Take 1,200 mg by mouth 2 (two) times daily.      [provider]  Fluticasone Furoate-Vilanterol (BREO ELLIPTA) 100-25 MCG/INH AEPB Inhale into the lungs.    [provider]  metoprolol tartrate (LOPRESSOR) 25 MG tablet TAKE 1 TABLET (25 MG TOTAL) BY MOUTH 2 (TWO) TIMES DAILY. 03/31/12   Minus Breeding, MD  Multiple Vitamin (MULTIVITAMIN) tablet Take 1 tablet by mouth daily.      [provider]  naproxen sodium (ANAPROX) 220 MG tablet Take 220 mg by mouth 2 (two) times daily with a meal.    [provider]  Pseudoephedrine-Guaifenesin (MUCINEX D PO) Take by mouth daily.    [provider]  tiotropium (SPIRIVA) 18 MCG inhalation capsule Place 18 mcg into inhaler and inhale daily.    [provider]  TRELEGY ELLIPTA 100-62.5-25 MCG/INH AEPB Inhale 1 puff into the lungs daily. 12/23/18   [provider]    Physical Exam: Vitals:   01-20-2019 1203 01-20-19 1330 20-Jan-2019 1400 20-Jan-2019 1412  BP:  104/60 102/73   Pulse:  71 77   Resp:  (!) 24 (!) 28   Temp: 99.5 F (37.5 C)     TempSrc: Rectal     SpO2:  95% 91%   Weight:    78 kg  Height:    5\' 8"  (1.727 m)     Vitals:   20-Jan-2019 1203 2019/01/20 1330 2019-01-20 1400 Jan 20, 2019 1412  BP:  104/60 102/73   Pulse:  71 77   Resp:  (!) 24 (!) 28   Temp: 99.5 F (37.5 C)     TempSrc: Rectal     SpO2:  95% 91%   Weight:    78 kg  Height:    5\' 8"  (1.727 m)   Constitutional: NAD, calm, comfortable, wearing non-rebreather Eyes: PERRL, lids and conjunctivae normal ENMT: Mucous membranes are moist. Posterior pharynx clear of any exudate or lesions.Normal dentition.  Neck: normal, supple, no masses, no thyromegaly Respiratory: bilateral crackles, no accessory muscle use Cardiovascular: irregular rate and rhythm, no murmurs / rubs / gallops. No extremity edema. 2+ pedal pulses. No carotid bruits.  Abdomen: no tenderness, no masses palpated. No hepatosplenomegaly. Bowel sounds positive.  Musculoskeletal: no clubbing / cyanosis. No joint deformity upper and lower extremities. Good ROM, no contractures. Normal muscle tone.  Skin: no rashes, lesions, ulcers. No induration Neurologic: CN 2-12 grossly intact. Sensation and strength grossly non-focal Psychiatric: Normal judgment and insight. Alert and oriented, normal mood  Labs on Admission: I have personally reviewed following labs and imaging studies  CBC: Recent Labs  Lab 20-Jan-2019 1148  WBC 5.6  NEUTROABS 3.7  HGB 15.3  HCT 48.2  MCV 89.8  PLT 841*   Basic Metabolic Panel: Recent Labs  Lab 20-Jan-2019 1148  NA 139  K 3.9  CL 105  CO2 23   GLUCOSE 94  BUN 29*  CREATININE 1.06  CALCIUM 8.2*   GFR: Estimated Creatinine Clearance: 46.6 mL/min (by C-G formula based on SCr of 1.06 mg/dL). Liver Function Tests: Recent Labs  Lab 01/20/19 1148  AST 58*  ALT 21  ALKPHOS 60  BILITOT 1.2  PROT 6.9  ALBUMIN 3.6   No results for input(s): LIPASE, AMYLASE in the last 168 hours. No results for input(s): AMMONIA in the last 168 hours. Coagulation Profile: No results for input(s): INR, PROTIME in the last 168 hours. Cardiac Enzymes: No results for input(s):  CKTOTAL, CKMB, CKMBINDEX, TROPONINI in the last 168 hours. BNP (last 3 results) No results for input(s): PROBNP in the last 8760 hours. HbA1C: No results for input(s): HGBA1C in the last 72 hours. CBG: No results for input(s): GLUCAP in the last 168 hours. Lipid Profile: Recent Labs    January 27, 2019 1309  TRIG 81   Thyroid Function Tests: No results for input(s): TSH, T4TOTAL, FREET4, T3FREE, THYROIDAB in the last 72 hours. Anemia Panel: No results for input(s): VITAMINB12, FOLATE, FERRITIN, TIBC, IRON, RETICCTPCT in the last 72 hours. Urine analysis:    Component Value Date/Time   COLORURINE AMBER (A) 2019/01/27 1149   APPEARANCEUR HAZY (A) January 27, 2019 1149   LABSPEC 1.025 January 27, 2019 1149   PHURINE 5.0 01-27-19 1149   GLUCOSEU NEGATIVE 27-Jan-2019 1149   HGBUR MODERATE (A) Jan 27, 2019 1149   BILIRUBINUR NEGATIVE 01-27-2019 1149   KETONESUR 5 (A) 01-27-19 1149   PROTEINUR 100 (A) 27-Jan-2019 1149   UROBILINOGEN 1.0 06/12/2008 1823   NITRITE NEGATIVE 01/27/19 1149   LEUKOCYTESUR NEGATIVE 2019-01-27 1149    Radiological Exams on Admission: Dg Chest Port 1 View  Result Date: 2019/01/27 CLINICAL DATA:  Hypoxic, COVID positive EXAM: PORTABLE CHEST 1 VIEW COMPARISON:  Radiograph 07/25/2008 FINDINGS: Sternotomy wires overlie obscured cardiac silhouette. Low lung volumes with elevation of the RIGHT hemidiaphragm. There is perihilar airspace disease which obscures  the cardiac shadow. No pneumothorax. Severe degenerate changes of the shoulders. Gaseous distention of the colon IMPRESSION: Bibasilar airspace disease in a pattern typical of viral pneumonia (COVID pneumonia). Low lung volumes. Electronically Signed   By: Genevive Bi M.D.   On: 2019-01-27 13:45    EKG: Independently reviewed.   Assessment/Plan VEDANTH SIRICO is a 85 y.o. male with medical history significant of CAD s/p CABG, COPD, BPH, R. hemidaphram paralysis, Osteoarthritis who presents from home with generalized weakness presenting with acute hypoxemic respiratory failure 2/2 COVID-19 Pneumonia.  # Acute Hypoxemic Respiratory Failure # COVID-19 Pneumonia # COPD  - patient initially on 15 L via non-rebreather, wean as tolerated.  Initially given a dose for possible concurrent bacterial pneumonia but with normal procalcitonin and CXR findings more reflective of hemidiaphragmatic paralysis discontinued antibiotics - continue pta inhalers - consulted pharmD with Remdesivir dosing - continue with dexamethasone - will admit to [facility]  # New onset Atrial Fibrillation - d/w family and despite patient's CABG hx they do not recall a prior diagnosis of Atrial Fibrillation.  We discussed risks of A. Fib with regard to stroke and anticoagulation.  They are amenable to anticoagulation. - continue metoprolol and monitor on telemetry - will order Echo - add on TSH - anticipate initiation of AC pending echo findings, review of telemetry  # Abnormal UA - pyuria, rare bacteria, unclear if truly a UTI, difficult to assess symptoms - received Abx today, will continue Ceftriaxone from 12/9 while following up culture  # CAD s/p CABG - continue aspirin, atorvastatin, metoprolol  # AAA - surveillance imaging, followed previously with Cardiology  # Osteoarthritis - tylenol, prn topical voltaren  DVT prophylaxis: Lovenox, would step up to therapeutic dosing if A. Fib continues and pending Echo switch  to Apixaban Code Status: Full upon d/w family Family Communication: Spoke with Roslyn over the phone who had her sister with her Disposition Plan: pending Admission status: Admit to [facility]   Clydia Llano MD [facility] Pager [phone]  If 7PM-7AM, please contact night-coverage [email or web address] Password [facility]  Jan 27, 2019, 3:33 PM

## 2019-01-01 NOTE — Progress Notes (Signed)
1845: Pt arrived via EMS. Placed on bedside monitor and VS obtained. SpO2 100% on NRB, switched to 10 L HFNC sat's now 88-90%. HR irregular 70's. Oriented to room and unit policies, instructed on how to use call bell, bed alarm on. Denies needs at this time, will monitor

## 2019-01-01 NOTE — ED Provider Notes (Signed)
[facility]-EMERGENCY DEPT Provider Note   CSN: [id number] Arrival date & time: 01/24/2019  1109     History   Chief Complaint Chief Complaint  Patient presents with  . Weakness  . Cough    HPI Jermaine Wright is a 85 y.o. male with past medical history of AAA, COPD, CAD, presenting to the emergency department via EMS with 3 days of generalized weakness.  Patient endorses productive cough as well.  Per EMS, family reports he needed assistance with ambulation throughout the house due to weakness which is not usual for him.  He is also been less oriented than usual and somewhat confused. Family reports foul smelling urine.  On EMS arrival he was noted to be pale and cyanotic on room air with O2 saturation of 68%.  He was placed on nonrebreather with O2 saturation to 95%.  He was also noted to be hypotensive at 90/50, given 1500 cc NS in route.  CBG 141. Patient denies shortness of breath, abdominal pain, vomiting, diarrhea, urinary symptoms, fever.  Patient denies known history of A. fib.     The history is provided by the patient and the EMS personnel.    Past Medical History:  Diagnosis Date  . AAA (abdominal aortic aneurysm) (HCC)   . Arthritis   . Asthma   . BPH with elevated PSA   . CAD (coronary artery disease)    Prior MI with CABG in 2010  . COPD (chronic obstructive pulmonary disease) (HCC)   . H/O: asbestos exposure   . Hemidiaphragm paralysis    RIGHT HEMIDIAPHRAGM  . Herpes 02/2008  . LV dysfunction    EF 55% per echo in 8/12  . OA (osteoarthritis)   . SOB (shortness of breath)     Patient Active Problem List   Diagnosis Date Noted  . Bruit 03/07/2011  . Obesity 03/07/2011  . CAD (coronary artery disease) 09/07/2010  . AAA (abdominal aortic aneurysm) (HCC) 09/07/2010  . LV dysfunction 09/07/2010  . Alcohol abuse 09/07/2010    Past Surgical History:  Procedure Laterality Date  . CARDIAC CATHETERIZATION  06/17/2008   THERE WAS INCOMPLETE  FILLING OF THE VENTRICLE, EF 40% WITH VERY MILD ANTERIOR HYPOKINESIS  . CORONARY ARTERY BYPASS GRAFT  06/2008   FOR CLASS IV UNSTABLE ANGINA WITH SUBENDOCARDIAL INFARCTION  . HERNIA REPAIR  1954  . HYDROCELE EXCISION  07/11/2011   Procedure: HYDROCELECTOMY ADULT;  Surgeon: Marcine Matar, MD;  Location: [facility];  Service: Urology;  Laterality: Left;     . US ECHOCARDIOGRAPHY  09/03/2008   EF 40-45%        Home Medications    Prior to Admission medications   Medication Sig Start Date End Date Taking? Authorizing Provider  aspirin 81 MG tablet Take 81 mg by mouth daily.      [provider]  atorvastatin (LIPITOR) 10 MG tablet Take 10 mg by mouth every evening. 07/05/11   Rollene Rotunda, MD  atorvastatin (LIPITOR) 20 MG tablet Take 20 mg by mouth daily. 12/12/18   [provider]  diclofenac sodium (VOLTAREN) 1 % GEL Apply topically as needed.      [provider]  Docusate Sodium (DULCOLAX STOOL SOFTENER PO) Take by mouth.    [provider]  fish oil-omega-3 fatty acids 1000 MG capsule Take 1,200 mg by mouth 2 (two) times daily.      [provider]  Fluticasone Furoate-Vilanterol (BREO ELLIPTA) 100-25 MCG/INH AEPB Inhale into the lungs.  [provider]  metoprolol tartrate (LOPRESSOR) 25 MG tablet TAKE 1 TABLET (25 MG TOTAL) BY MOUTH 2 (TWO) TIMES DAILY. 03/31/12   Minus Breeding, MD  Multiple Vitamin (MULTIVITAMIN) tablet Take 1 tablet by mouth daily.      [provider]  naproxen sodium (ANAPROX) 220 MG tablet Take 220 mg by mouth 2 (two) times daily with a meal.    [provider]  Pseudoephedrine-Guaifenesin (MUCINEX D PO) Take by mouth daily.    [provider]  tiotropium (SPIRIVA) 18 MCG inhalation capsule Place 18 mcg into inhaler and inhale daily.    [provider]  TRELEGY ELLIPTA 100-62.5-25 MCG/INH AEPB Inhale 1 puff into the lungs daily. 12/23/18   [provider]     Family History Family History  Problem Relation Age of Onset  . Heart attack Mother   . Aneurysm Mother        AAA    Social History Social History   Tobacco Use  . Smoking status: Former Smoker    Packs/day: 1.00    Years: 44.00    Pack years: 44.00    Types: Cigarettes    Quit date: 08/30/1985    Years since quitting: 33.3  . Smokeless tobacco: Never Used  Substance Use Topics  . Alcohol use: No  . Drug use: No     Allergies   Chlorhexidine gluconate [chlorhexidine]   Review of Systems Review of Systems  Constitutional: Negative for fever.  Respiratory: Positive for cough.   Gastrointestinal: Negative for abdominal pain, diarrhea and vomiting.  All other systems reviewed and are negative.    Physical Exam Updated Vital Signs BP 102/73   Pulse 77   Temp 99.5 F (37.5 C) (Rectal)   Resp (!) 28   Ht 5\' 8"  (1.727 m)   Wt 78 kg   SpO2 91%   BMI 26.15 kg/m   Physical Exam Vitals signs and nursing note reviewed.  Constitutional:      Appearance: He is well-developed.  HENT:     Head: Normocephalic and atraumatic.  Eyes:     Extraocular Movements: Extraocular movements intact.     Conjunctiva/sclera: Conjunctivae normal.     Pupils: Pupils are equal, round, and reactive to light.  Cardiovascular:     Rate and Rhythm: Normal rate.     Pulses: Normal pulses.  Pulmonary:     Effort: Pulmonary effort is normal.     Comments: Wheezes and rales present bilaterally.  O2 saturation 93% on nonrebreather Abdominal:     General: Bowel sounds are normal.     Palpations: Abdomen is soft.     Tenderness: There is no abdominal tenderness. There is no guarding or rebound.  Musculoskeletal:     Right lower leg: No edema.     Left lower leg: No edema.  Skin:    General: Skin is warm.  Neurological:     Mental Status: He is alert.  Psychiatric:        Behavior: Behavior normal.      ED Treatments / Results  Labs (all labs ordered are listed, but only  abnormal results are displayed) Labs Reviewed  COMPREHENSIVE METABOLIC PANEL - Abnormal; Notable for the following components:      Result Value   BUN 29 (*)    Calcium 8.2 (*)    AST 58 (*)    All other components within normal limits  CBC WITH DIFFERENTIAL/PLATELET - Abnormal; Notable for the following components:   Platelets  124 (*)    All other components within normal limits  URINALYSIS, ROUTINE W REFLEX MICROSCOPIC - Abnormal; Notable for the following components:   Color, Urine AMBER (*)    APPearance HAZY (*)    Hgb urine dipstick MODERATE (*)    Ketones, ur 5 (*)    Protein, ur 100 (*)    Bacteria, UA RARE (*)    All other components within normal limits  D-DIMER, QUANTITATIVE (NOT AT [facility]) - Abnormal; Notable for the following components:   D-Dimer, Quant 1.85 (*)    All other components within normal limits  LACTATE DEHYDROGENASE - Abnormal; Notable for the following components:   LDH 381 (*)    All other components within normal limits  FIBRINOGEN - Abnormal; Notable for the following components:   Fibrinogen 479 (*)    All other components within normal limits  POC SARS CORONAVIRUS 2 AG -  ED - Abnormal; Notable for the following components:   SARS Coronavirus 2 Ag POSITIVE (*)    All other components within normal limits  CULTURE, BLOOD (ROUTINE X 2)  CULTURE, BLOOD (ROUTINE X 2)  URINE CULTURE  MRSA PCR SCREENING  LACTIC ACID, PLASMA  TRIGLYCERIDES  LACTIC ACID, PLASMA  PROCALCITONIN  FERRITIN  C-REACTIVE PROTEIN  STREP PNEUMONIAE URINARY ANTIGEN  LEGIONELLA PNEUMOPHILA SEROGP 1 UR AG    EKG None  Radiology Dg Chest Port 1 View  Result Date: 01/16/2019 CLINICAL DATA:  Hypoxic, COVID positive EXAM: PORTABLE CHEST 1 VIEW COMPARISON:  Radiograph 07/25/2008 FINDINGS: Sternotomy wires overlie obscured cardiac silhouette. Low lung volumes with elevation of the RIGHT hemidiaphragm. There is perihilar airspace disease which obscures the cardiac shadow. No  pneumothorax. Severe degenerate changes of the shoulders. Gaseous distention of the colon IMPRESSION: Bibasilar airspace disease in a pattern typical of viral pneumonia (COVID pneumonia). Low lung volumes. Electronically Signed   By: Genevive BiStewart  Edmunds M.D.   On: 01/18/2019 13:45    Procedures .Critical Care Performed by: Special Ranes, [place] N, PA-C Authorized by: Clinten Howk, [place] N, PA-C   Critical care provider statement:    Critical care time (minutes):  45   Critical care time was exclusive of:  Separately billable procedures and treating other patients and teaching time   Critical care was necessary to treat or prevent imminent or life-threatening deterioration of the following conditions:  Respiratory failure   Critical care was time spent personally by me on the following activities:  Discussions with consultants, evaluation of patient's response to treatment, examination of patient, ordering and performing treatments and interventions, ordering and review of laboratory studies, ordering and review of radiographic studies, pulse oximetry, re-evaluation of patient's condition, obtaining history from patient or surrogate and review of old charts   I assumed direction of critical care for this patient from another provider in my specialty: no     (including critical care time)  Medications Ordered in ED Medications  metroNIDAZOLE (FLAGYL) IVPB 500 mg (500 mg Intravenous New Bag/Given 01/07/2019 1404)  vancomycin (VANCOCIN) 1,500 mg in sodium chloride 0.9 % 500 mL IVPB (has no administration in time range)  ceFEPIme (MAXIPIME) 2 g in sodium chloride 0.9 % 100 mL IVPB (has no administration in time range)  vancomycin (VANCOCIN) 1,250 mg in sodium chloride 0.9 % 250 mL IVPB (has no administration in time range)  dexamethasone (DECADRON) injection 10 mg (10 mg Intravenous Given 01/05/2019 1403)  ceFEPIme (MAXIPIME) 2 g in sodium chloride 0.9 % 100 mL IVPB (2 g Intravenous New Bag/Given 01/19/2019 1403)  Initial Impression / Assessment and Plan / ED Course  I have reviewed the triage vital signs and the nursing notes.  Pertinent labs & imaging results that were available during my care of the patient were reviewed by me and considered in my medical decision making (see chart for details).  Clinical Course as of Jan 01 1504  Tue Jan 01, 2019  1332 Per patient's daughter, no known COVID contacts. Agrees with EMS report. However no known hx of afib.   [JR]    Clinical Course User Index [JR] Lael Wetherbee, [place] N, PA-C      Jermaine Wright was evaluated in Emergency Department on 01/09/2019 for the symptoms described in the history of present illness. He was evaluated in the context of the global COVID-19 pandemic, which necessitated consideration that the patient might be at risk for infection with the SARS-CoV-2 virus that causes COVID-19. Institutional protocols and algorithms that pertain to the evaluation of patients at risk for COVID-19 are in a state of rapid change based on information released by regulatory bodies including the CDC and federal and state organizations. These policies and algorithms were followed during the patient's care in the ED.   Pt presenting with 3 days of weakness, disorientation, productive cough. On EMS arrival, pt hypoxic at 68% on RA, improved with nonrebreather. Hypotensive at 90/70, improved with 1500cc NS administered en route. On arrival, BP 111/80. O2 sat 93% on nonrebreather. Afebrile. Lungs with wheezes and rales b/l. abd is benign. EKG with a fib normal rate, no known hx per pt and pt's daughter. Not on anticoagulation. Differential includes COPD exacerbation, COVID-19, PE.   Labs with normal WBC and hgb. No significant electrolyte abnormalities on metabolic panel. POC covid is positive. CXR with bilateral infiltrate. Pt admitted to hospital service for hypoxia and respiratory failure in the setting of COVID-19 illness. Remains stable on nonrebreather at  this time. Pt's family agreeable to plan.  Final Clinical Impressions(s) / ED Diagnoses   Final diagnoses:  Pneumonia due to COVID-19 virus  Acute respiratory failure with hypoxia [facility])    ED Discharge Orders    None       Latasha Buczkowski, [place] N, PA-C 12/31/2018 1505    Lorre Nick, MD 01/02/19 1332

## 2019-01-02 ENCOUNTER — Other Ambulatory Visit: Payer: Self-pay

## 2019-01-02 ENCOUNTER — Inpatient Hospital Stay (HOSPITAL_COMMUNITY): Payer: Medicare Other

## 2019-01-02 ENCOUNTER — Encounter (HOSPITAL_COMMUNITY): Payer: Self-pay

## 2019-01-02 DIAGNOSIS — I4891 Unspecified atrial fibrillation: Secondary | ICD-10-CM

## 2019-01-02 LAB — COMPREHENSIVE METABOLIC PANEL
ALT: 20 U/L (ref 0–44)
AST: 51 U/L — ABNORMAL HIGH (ref 15–41)
Albumin: 3.3 g/dL — ABNORMAL LOW (ref 3.5–5.0)
Alkaline Phosphatase: 57 U/L (ref 38–126)
Anion gap: 10 (ref 5–15)
BUN: 27 mg/dL — ABNORMAL HIGH (ref 8–23)
CO2: 26 mmol/L (ref 22–32)
Calcium: 8.4 mg/dL — ABNORMAL LOW (ref 8.9–10.3)
Chloride: 107 mmol/L (ref 98–111)
Creatinine, Ser: 0.89 mg/dL (ref 0.61–1.24)
GFR calc Af Amer: >60 mL/min (ref >60)
GFR calc non Af Amer: >60 mL/min (ref >60)
Glucose, Bld: 127 mg/dL — ABNORMAL HIGH (ref 70–99)
Potassium: 4.4 mmol/L (ref 3.5–5.1)
Sodium: 143 mmol/L (ref 135–145)
Total Bilirubin: 1.1 mg/dL (ref 0.3–1.2)
Total Protein: 6.3 g/dL — ABNORMAL LOW (ref 6.5–8.1)

## 2019-01-02 LAB — CBC WITH DIFFERENTIAL/PLATELET
Abs Immature Granulocytes: 0.03 10*3/uL (ref 0.00–0.07)
Basophils Absolute: 0 10*3/uL (ref 0.0–0.1)
Basophils Relative: 0 %
Eosinophils Absolute: 0 10*3/uL (ref 0.0–0.5)
Eosinophils Relative: 0 %
HCT: 46.6 % (ref 39.0–52.0)
Hemoglobin: 14.7 g/dL (ref 13.0–17.0)
Immature Granulocytes: 1 %
Lymphocytes Relative: 14 %
Lymphs Abs: 0.9 10*3/uL (ref 0.7–4.0)
MCH: 28.5 pg (ref 26.0–34.0)
MCHC: 31.5 g/dL (ref 30.0–36.0)
MCV: 90.3 fL (ref 80.0–100.0)
Monocytes Absolute: 0.5 10*3/uL (ref 0.1–1.0)
Monocytes Relative: 8 %
Neutro Abs: 5 10*3/uL (ref 1.7–7.7)
Neutrophils Relative %: 77 %
Platelets: 121 10*3/uL — ABNORMAL LOW (ref 150–400)
RBC: 5.16 MIL/uL (ref 4.22–5.81)
RDW: 14.6 % (ref 11.5–15.5)
WBC: 6.4 10*3/uL (ref 4.0–10.5)
nRBC: 0 % (ref 0.0–0.2)

## 2019-01-02 LAB — ECHOCARDIOGRAM COMPLETE
Height: 68 in
Weight: 2752 oz

## 2019-01-02 LAB — LEGIONELLA PNEUMOPHILA SEROGP 1 UR AG: L. pneumophila Serogp 1 Ur Ag: NEGATIVE

## 2019-01-02 LAB — URINE CULTURE: Culture: NO GROWTH

## 2019-01-02 LAB — MAGNESIUM: Magnesium: 2.4 mg/dL (ref 1.7–2.4)

## 2019-01-02 LAB — D-DIMER, QUANTITATIVE: D-Dimer, Quant: 2.19 ug/mL-FEU — ABNORMAL HIGH (ref 0.00–0.50)

## 2019-01-02 LAB — PHOSPHORUS: Phosphorus: 3.4 mg/dL (ref 2.5–4.6)

## 2019-01-02 LAB — C-REACTIVE PROTEIN: CRP: 9.7 mg/dL — ABNORMAL HIGH (ref <1.0)

## 2019-01-02 LAB — PROCALCITONIN: Procalcitonin: <0.1 ng/mL

## 2019-01-02 LAB — ABO/RH: ABO/RH(D): O NEG

## 2019-01-02 LAB — FERRITIN: Ferritin: 1272 ng/mL — ABNORMAL HIGH (ref 24–336)

## 2019-01-02 LAB — MRSA PCR SCREENING: MRSA by PCR: NEGATIVE

## 2019-01-02 NOTE — Plan of Care (Signed)
  Problem: Coping: Goal: Psychosocial and spiritual needs will be supported 01/02/2019 0230 by Hillary Bow, RN Outcome: Progressing 01/02/2019 0230 by Hillary Bow, RN Outcome: Not Progressing   Problem: Health Behavior/Discharge Planning: Goal: Ability to manage health-related needs will improve 01/02/2019 0230 by Hillary Bow, RN Outcome: Progressing 01/02/2019 0230 by Hillary Bow, RN Outcome: Not Progressing   Problem: Clinical Measurements: Goal: Ability to maintain clinical measurements within normal limits will improve 01/02/2019 0230 by Hillary Bow, RN Outcome: Progressing 01/02/2019 0230 by Hillary Bow, RN Outcome: Not Progressing Goal: Diagnostic test results will improve 01/02/2019 0230 by Hillary Bow, RN Outcome: Progressing 01/02/2019 0230 by Hillary Bow, RN Outcome: Not Progressing Goal: Cardiovascular complication will be avoided 01/02/2019 0230 by Hillary Bow, RN Outcome: Progressing 01/02/2019 0230 by Hillary Bow, RN Outcome: Not Progressing

## 2019-01-02 NOTE — Progress Notes (Signed)
Pt was on 10L HFNC when I arrived at 2300. Slowly decrease O2 therapy, pt is now on [facility] O2sats 99%. Will continue to monitor

## 2019-01-02 NOTE — Evaluation (Signed)
Physical Therapy Evaluation Patient Details Name: Jermaine Wright MRN: [id number] DOB: 04-18-1928 Today's Date: 01/02/2019   History of Present Illness  Pt is an 85 y.o. male admitted 24-Jan-2019 with weakness, fatigue and SOB; tested (+) COVID-19. PMH includes afib, CAD, COPD, OA, asthma.    Clinical Impression  Pt presents with an overall decrease in functional mobility secondary to above. PTA, pt ambulatory with walking stick, lives with family who provides 24/7 supervision and assist as needed. Today, pt requiring modA to stand and transfer to recliner; SpO2 down to 80s on 5-6L HFNC (unreliable pleth), maintaining 90-93% on 6L HFNC with seated rest. Pt motivated to participate and hopeful to regain strength. Spoke with daughter Brunetta Genera) on phone regarding d/c plans; family open to post-acute rehab but would prefer pt return home if they're able to provide necessary assist; TBD as they're all currently getting tested for COVID. Will follow acutely to address established goals.    Follow Up Recommendations SNF(vs. HHPT with 24/7 assist pending progression)    Equipment Recommendations  Rolling walker with 5" wheels    Recommendations for Other Services       Precautions / Restrictions Precautions Precautions: Fall Restrictions Weight Bearing Restrictions: No      Mobility  Bed Mobility Overal bed mobility: Needs Assistance Bed Mobility: Supine to Sit     Supine to sit: Mod assist     General bed mobility comments: ModA for HHA to elevate trunk  Transfers Overall transfer level: Needs assistance Equipment used: 1 person hand held assist Transfers: Sit to/from [employer] Transfers Sit to Stand: Mod assist Stand pivot transfers: Mod assist       General transfer comment: ModA to assist trunk elevation and maintain balance with HHA, pt with BLEs extended against side of bed and trunk flexed with posterior lean; modA to pivot to recliner, pt attempting steps but limited  by posterior lean  Ambulation/Gait                Stairs            Wheelchair Mobility    Modified Rankin (Stroke Patients Only)       Balance Overall balance assessment: Needs assistance   Sitting balance-Leahy Scale: Fair Sitting balance - Comments: Able to maintain static sitting EOB without assist     Standing balance-Leahy Scale: Poor                               Pertinent Vitals/Pain Pain Assessment: No/denies pain    Home Living Family/patient expects to be discharged to:: Private residence Living Arrangements: Children;Other relatives Available Help at Discharge: Family;Available 24 hours/day Type of Home: House       Home Layout: Two level;Bed/bath upstairs Home Equipment: Other (comment)(walking stick) Additional Comments: Spoke with daughter (rosslyn) to verify details    Prior Function Level of Independence: Needs assistance   Gait / Transfers Assistance Needed: Pt reports mod indep using walking stick. Family available for assist as needed  ADL's / Homemaking Assistance Needed: Pt and daughter report family always provides supervision for bathing and toileting. Family cooks meals (pt assists with food prep as able)  Comments: Does not wear home O2, but uses inhaler; endorses long h/o tobacco use     Hand Dominance        Extremity/Trunk Assessment   Upper Extremity Assessment Upper Extremity Assessment: Generalized weakness    Lower Extremity Assessment Lower Extremity Assessment:  Generalized weakness    Cervical / Trunk Assessment Cervical / Trunk Assessment: Kyphotic  Communication   Communication: HOH  Cognition Arousal/Alertness: Awake/alert Behavior During Therapy: WFL for tasks assessed/performed Overall Cognitive Status: Impaired/Different from baseline Area of Impairment: Orientation;Attention;Following commands;Safety/judgement;Awareness;Problem solving;Memory                   Current  Attention Level: Sustained;Selective Memory: Decreased short-term memory Following Commands: Follows one step commands consistently;Follows one step commands with increased time;Follows multi-step commands inconsistently;Follows multi-step commands with increased time Safety/Judgement: Decreased awareness of deficits Awareness: Emergent Problem Solving: Slow processing;Difficulty sequencing;Requires verbal cues General Comments: Answering some orientation questions initially wrong but able to correct without cues (seems more related to slowed processing); A&O to self/birthday, current date, location as [facility] (reoriented to [facility] and pt aware of old [facility]), and admitted because he was feeling sick (told about COVID and pt states "I know"). Good sense of humor and following commands appropriately, sometimes with increased time      General Comments General comments (skin integrity, edema, etc.): SpO2 down to 80s on 5-6L HFNC (unreliable pleth), maintaining 90-93% once resting in chair on 6L HFNC with reliable pleth    Exercises     Assessment/Plan    PT Assessment Patient needs continued PT services  PT Problem List Decreased strength;Decreased activity tolerance;Decreased balance;Decreased mobility;Decreased cognition;Decreased knowledge of use of DME;Cardiopulmonary status limiting activity       PT Treatment Interventions DME instruction;Gait training;Stair training;Functional mobility training;Therapeutic activities;Therapeutic exercise;Balance training;Patient/family education    PT Goals (Current goals can be found in the Care Plan section)  Acute Rehab PT Goals Patient Stated Goal: "If I can't go home well then I don't want to go home" PT Goal Formulation: With patient Time For Goal Achievement: 01/16/19 Potential to Achieve Goals: Good    Frequency Min 3X/week   Barriers to discharge        Co-evaluation               AM-PAC PT "6 Clicks" Mobility  Outcome  Measure Help needed turning from your back to your side while in a flat bed without using bedrails?: A Lot Help needed moving from lying on your back to sitting on the side of a flat bed without using bedrails?: A Lot Help needed moving to and from a bed to a chair (including a wheelchair)?: A Lot Help needed standing up from a chair using your arms (e.g., wheelchair or bedside chair)?: A Lot Help needed to walk in hospital room?: A Lot Help needed climbing 3-5 steps with a railing? : Total 6 Click Score: 11    End of Session Equipment Utilized During Treatment: Oxygen Activity Tolerance: Patient tolerated treatment well Patient left: in chair;with call bell/phone within reach;with chair alarm set;with nursing/sitter in room Nurse Communication: Mobility status PT Visit Diagnosis: Other abnormalities of gait and mobility (R26.89);Muscle weakness (generalized) (M62.81)    Time: 1829-9371 PT Time Calculation (min) (ACUTE ONLY): 50 min   Charges:   PT Evaluation $PT Eval Moderate Complexity: 1 Mod PT Treatments $Therapeutic Activity: 8-22 mins $Self Care/Home Management: 8-22   Ina Homes, PT, DPT Acute Rehabilitation Services  Pager [phone] Office [phone]  Malachy Chamber 01/02/2019, 12:19 PM

## 2019-01-02 NOTE — Plan of Care (Signed)

## 2019-01-02 NOTE — Progress Notes (Signed)
  Echocardiogram 2D Echocardiogram has been performed.  Jermaine Wright 01/02/2019, 3:18 PM

## 2019-01-02 NOTE — Plan of Care (Signed)
Pt sat on the chair this afternoon and tolerated movement well. Pt does desaturate on exertion and while eating. Daughter was updated and all questions were answered.   Problem: Education: Goal: Knowledge of risk factors and measures for prevention of condition will improve Outcome: Progressing   Problem: Coping: Goal: Psychosocial and spiritual needs will be supported Outcome: Progressing   Problem: Respiratory: Goal: Will maintain a patent airway Outcome: Progressing Goal: Complications related to the disease process, condition or treatment will be avoided or minimized Outcome: Progressing   Problem: Education: Goal: Knowledge of General Education information will improve Description: Including pain rating scale, medication(s)/side effects and non-pharmacologic comfort measures Outcome: Progressing   Problem: Health Behavior/Discharge Planning: Goal: Ability to manage health-related needs will improve Outcome: Progressing   Problem: Clinical Measurements: Goal: Ability to maintain clinical measurements within normal limits will improve Outcome: Progressing Goal: Will remain free from infection Outcome: Progressing Goal: Diagnostic test results will improve Outcome: Progressing Goal: Respiratory complications will improve Outcome: Progressing Goal: Cardiovascular complication will be avoided Outcome: Progressing   Problem: Activity: Goal: Risk for activity intolerance will decrease Outcome: Progressing   Problem: Nutrition: Goal: Adequate nutrition will be maintained Outcome: Progressing   Problem: Coping: Goal: Level of anxiety will decrease Outcome: Progressing   Problem: Elimination: Goal: Will not experience complications related to bowel motility Outcome: Progressing Goal: Will not experience complications related to urinary retention Outcome: Progressing   Problem: Pain Managment: Goal: General experience of comfort will improve Outcome: Progressing    Problem: Safety: Goal: Ability to remain free from injury will improve Outcome: Progressing   Problem: Skin Integrity: Goal: Risk for impaired skin integrity will decrease Outcome: Progressing

## 2019-01-02 NOTE — Progress Notes (Signed)
PROGRESS NOTE    Jermaine SchleinJohn B Wright  ZOX:[id number] DOB: 1928/11/19 DOA: 01/20/2019 PCP: Gaspar Garbeisovec, Richard W, MD    Brief Narrative:  Patient is 85 year old gentleman with history of coronary artery disease status post CABG, COPD, BPH, right hemidiaphragm paralysis and osteoarthritis who presented to the emergency room from home with generalized weakness.  Patient had not been feeling well since about 4 to 5 days, he was tired. In the emergency room, he was hypoxic and initially required 15 L via nonrebreather.  He was started on COVID-19 directed therapies and [facility].  Also found to have A. fib on monitor.   Assessment & Plan:   Active Problems:   Pneumonia due to COVID-19 virus  Pneumonia due to COVID-19 virus with acute hypoxemic respiratory failure in the setting of underlying COPD: Continue to monitor due to significant symptoms  chest physiotherapy, incentive spirometry, deep breathing exercises, sputum induction, mucolytic's and bronchodilators. Supplemental oxygen to keep saturations more than 90%. Covid directed therapy with , steroids, currently on dexamethasone continue. remdesivir, day 2/5 antibiotics , covered with Rocephin given abnormal urinalysis and COPD.  Continue. Due to severity of symptoms, patient will need daily inflammatory markers, chest x-rays, liver function test to monitor and direct COVID-19 therapies.  New onset A. fib: Paroxysmal A. fib.  Transient.  Currently in sinus rhythm.  Started on metoprolol.  TSH normal.  Echocardiogram pending.  Probably has undiagnosed underlying A. fib.  Unknown burden of A. fib.  We will continue to monitor in telemetry unit, if recurrence and persistent, will start patient on NOAC and refer to cardiology.  If low burden, will send to cardiology for Holter monitoring.  Abnormal UA: Unlikely UTI.  Asymptomatic.  Rocephin per #1.  CAD status post CABG: Continue aspirin atorvastatin and metoprolol.  Stable.  AAA: Surveillance with cardiology.  Stable.   DVT prophylaxis: Lovenox subcu Code Status: Full code Family Communication: Daughter Roslyn Disposition Plan: Home with 24/7 care versus skilled nursing facility.   Consultants:   None  Procedures:   None  Antimicrobials:   Rocephin, 11/01/2018>>>  Remdesivir, 11/01/2018>>>   Subjective: Patient seen and examined.  No overnight events.  He was on 6 L of oxygen with poor waveform, however he was able to communicate and talk in long sentences.  Afebrile overnight.  Feels extremely weak.  Denies any nausea vomiting or chest pain.  Objective: Vitals:   01/02/19 0533 01/02/19 0724 01/02/19 1119 01/02/19 1454  BP: 119/66 95/80 (!) 93/55 98/68  Pulse:  (!) 59 64 72  Resp:  (!) 24 20 (!) 23  Temp: 98.7 F (37.1 C) 98.6 F (37 C) 98.2 F (36.8 C) 97.6 F (36.4 C)  TempSrc: Axillary Oral Oral Axillary  SpO2:  97% 96% 92%  Weight:      Height:        Intake/Output Summary (Last 24 hours) at 01/02/2019 1506 Last data filed at 01/02/2019 1449 Gross per 24 hour  Intake 773.33 ml  Output 700 ml  Net 73.33 ml   Filed Weights   12/31/2018 1412  Weight: 78 kg    Examination:  General exam: Appears calm and comfortable, chronically sick looking, not in any distress. Respiratory system: Clear to auscultation. Respiratory effort normal.  Mostly conducted airway sounds.  Currently on 6 L of oxygen.  Looks comfortable. Cardiovascular system: S1 & S2 heard, RRR. No JVD, murmurs, rubs, gallops or clicks. No pedal edema. Gastrointestinal system: Abdomen is nondistended, soft and nontender. No organomegaly  or masses felt. Normal bowel sounds heard. Central nervous system: Alert and oriented. No focal neurological deficits. Extremities: Symmetric 5 x 5 power. Skin: No rashes, lesions or ulcers Psychiatry: Judgement and insight appear normal. Mood & affect appropriate.     Data Reviewed: I have personally reviewed following labs and  imaging studies  CBC: Recent Labs  Lab 01/13/2019 1148 01/02/19 0545  WBC 5.6 6.4  NEUTROABS 3.7 5.0  HGB 15.3 14.7  HCT 48.2 46.6  MCV 89.8 90.3  PLT 124* 121*   Basic Metabolic Panel: Recent Labs  Lab 01/14/2019 1148 01/02/19 0545  NA 139  --   K 3.9  --   CL 105  --   CO2 23  --   GLUCOSE 94  --   BUN 29*  --   CREATININE 1.06  --   CALCIUM 8.2*  --   MG  --  2.4  PHOS  --  3.4   GFR: Estimated Creatinine Clearance: 46.6 mL/min (by C-G formula based on SCr of 1.06 mg/dL). Liver Function Tests: Recent Labs  Lab 01/16/2019 1148  AST 58*  ALT 21  ALKPHOS 60  BILITOT 1.2  PROT 6.9  ALBUMIN 3.6   No results for input(s): LIPASE, AMYLASE in the last 168 hours. No results for input(s): AMMONIA in the last 168 hours. Coagulation Profile: No results for input(s): INR, PROTIME in the last 168 hours. Cardiac Enzymes: No results for input(s): CKTOTAL, CKMB, CKMBINDEX, TROPONINI in the last 168 hours. BNP (last 3 results) No results for input(s): PROBNP in the last 8760 hours. HbA1C: No results for input(s): HGBA1C in the last 72 hours. CBG: No results for input(s): GLUCAP in the last 168 hours. Lipid Profile: Recent Labs    12/26/2018 1309  TRIG 81   Thyroid Function Tests: Recent Labs    01/17/2019 1309  TSH 1.745   Anemia Panel: Recent Labs    12/31/2018 1309 01/02/19 0545  FERRITIN 1,469* 1,272*   Sepsis Labs: Recent Labs  Lab 01/12/2019 1148 01/20/2019 1309 01/02/19 0545  PROCALCITON  --  <0.10 <0.10  LATICACIDVEN 1.7  --   --     Recent Results (from the past 240 hour(s))  Culture, blood (routine x 2)     Status: None (Preliminary result)   Collection Time: 01/24/2019 11:48 AM   Specimen: BLOOD LEFT HAND  Result Value Ref Range Status   Specimen Description   Final    BLOOD LEFT HAND Performed at [facility], [address] [place], [place] [address]    Special Requests   Final    BOTTLES DRAWN AEROBIC AND ANAEROBIC Blood  Culture results may not be optimal due to an inadequate volume of blood received in culture bottles Performed at [facility], [address] [place], [place] [address]    Culture   Final    NO GROWTH < 24 HOURS Performed at [facility], [address], [place], [place] [address]    Report Status PENDING  Incomplete  Urine culture     Status: None   Collection Time: 12/26/2018 11:49 AM   Specimen: Urine, Clean Catch  Result Value Ref Range Status   Specimen Description   Final    URINE, CLEAN CATCH Performed at [facility], [address], [place], [place] [address]    Special Requests   Final    NONE Performed at [facility], [address], [place], [place] [address]  Culture   Final    NO GROWTH Performed at [facility], [place] [address], [place], [place] [address]    Report Status 01/02/2019 FINAL  Final  Culture, blood (routine x 2)     Status: None (Preliminary result)   Collection Time: 12/31/2018 11:52 AM   Specimen: BLOOD RIGHT HAND  Result Value Ref Range Status   Specimen Description   Final    BLOOD RIGHT HAND Performed at [place] [address] [place], [place] [address]    Special Requests   Final    BOTTLES DRAWN AEROBIC AND ANAEROBIC Blood Culture results may not be optimal due to an inadequate volume of blood received in culture bottles Performed at [place] [address], [place], [place] [address]    Culture   Final    NO GROWTH < 24 HOURS Performed at [place] [address], [place], [place] [address]    Report Status PENDING  Incomplete  MRSA PCR Screening     Status: None   Collection Time: 01/02/19  6:20 AM   Specimen: Nasopharyngeal  Result Value Ref Range Status   MRSA by PCR NEGATIVE NEGATIVE Final    Comment:        The GeneXpert MRSA Assay (FDA approved for NASAL specimens only), is one component of a  comprehensive MRSA colonization surveillance program. It is not intended to diagnose MRSA infection nor to guide or monitor treatment for MRSA infections. Performed at [facility], [place] [address], [place], [place] [address]          Radiology Studies: Dg Chest Port 1 View  Result Date: 12/31/2018 CLINICAL DATA:  Hypoxic, COVID positive EXAM: PORTABLE CHEST 1 VIEW COMPARISON:  Radiograph 07/25/2008 FINDINGS: Sternotomy wires overlie obscured cardiac silhouette. Low lung volumes with elevation of the RIGHT hemidiaphragm. There is perihilar airspace disease which obscures the cardiac shadow. No pneumothorax. Severe degenerate changes of the shoulders. Gaseous distention of the colon IMPRESSION: Bibasilar airspace disease in a pattern typical of viral pneumonia (COVID pneumonia). Low lung volumes. Electronically Signed   By: Suzy Bouchard M.D.   On: 12/26/2018 13:45        Scheduled Meds: . albuterol  2 puff Inhalation Q6H  . aspirin EC  81 mg Oral Daily  . atorvastatin  20 mg Oral Daily  . dexamethasone (DECADRON) injection  6 mg Intravenous Q24H  . enoxaparin (LOVENOX) injection  40 mg Subcutaneous Q24H  . umeclidinium bromide  1 puff Inhalation Daily   And  . fluticasone furoate-vilanterol  1 puff Inhalation Daily  . metoprolol tartrate  25 mg Oral BID  . multivitamin with minerals  1 tablet Oral Daily  . omega-3 acid ethyl esters  1 g Oral BID  . vitamin C  500 mg Oral Daily  . zinc sulfate  220 mg Oral Daily   Continuous Infusions: . cefTRIAXone (ROCEPHIN)  IV    . remdesivir 100 mg in NS 100 mL 100 mg (01/02/19 0903)     LOS: 1 day    Time spent: 35 minutes    Barb Merino, MD [facility] Pager [phone]

## 2019-01-03 ENCOUNTER — Inpatient Hospital Stay (HOSPITAL_COMMUNITY): Payer: Medicare Other

## 2019-01-03 DIAGNOSIS — I639 Cerebral infarction, unspecified: Secondary | ICD-10-CM

## 2019-01-03 LAB — CBC WITH DIFFERENTIAL/PLATELET
Abs Immature Granulocytes: 0.03 10*3/uL (ref 0.00–0.07)
Basophils Absolute: 0 10*3/uL (ref 0.0–0.1)
Basophils Relative: 0 %
Eosinophils Absolute: 0 10*3/uL (ref 0.0–0.5)
Eosinophils Relative: 0 %
HCT: 46.9 % (ref 39.0–52.0)
Hemoglobin: 14.9 g/dL (ref 13.0–17.0)
Immature Granulocytes: 0 %
Lymphocytes Relative: 12 %
Lymphs Abs: 0.9 10*3/uL (ref 0.7–4.0)
MCH: 28.3 pg (ref 26.0–34.0)
MCHC: 31.8 g/dL (ref 30.0–36.0)
MCV: 89 fL (ref 80.0–100.0)
Monocytes Absolute: 0.4 10*3/uL (ref 0.1–1.0)
Monocytes Relative: 5 %
Neutro Abs: 6.2 10*3/uL (ref 1.7–7.7)
Neutrophils Relative %: 83 %
Platelets: 167 10*3/uL (ref 150–400)
RBC: 5.27 MIL/uL (ref 4.22–5.81)
RDW: 14.4 % (ref 11.5–15.5)
WBC: 7.6 10*3/uL (ref 4.0–10.5)
nRBC: 0 % (ref 0.0–0.2)

## 2019-01-03 LAB — MAGNESIUM: Magnesium: 2.2 mg/dL (ref 1.7–2.4)

## 2019-01-03 LAB — PHOSPHORUS: Phosphorus: 2.3 mg/dL — ABNORMAL LOW (ref 2.5–4.6)

## 2019-01-03 LAB — FERRITIN: Ferritin: 1368 ng/mL — ABNORMAL HIGH (ref 24–336)

## 2019-01-03 LAB — C-REACTIVE PROTEIN: CRP: 7.2 mg/dL — ABNORMAL HIGH (ref <1.0)

## 2019-01-03 LAB — PROCALCITONIN: Procalcitonin: <0.1 ng/mL

## 2019-01-03 LAB — D-DIMER, QUANTITATIVE: D-Dimer, Quant: 2.29 ug/mL-FEU — ABNORMAL HIGH (ref 0.00–0.50)

## 2019-01-03 MED ORDER — ORAL CARE MOUTH RINSE
15.0000 mL | Freq: Two times a day (BID) | OROMUCOSAL | Status: DC
  Administered 2019-01-04 – 2019-01-06 (×5): 15 mL via OROMUCOSAL

## 2019-01-03 MED ORDER — ALTEPLASE (STROKE) FULL DOSE INFUSION
0.9000 mg/kg | Freq: Once | INTRAVENOUS | Status: AC
  Administered 2019-01-03: 70.2 mg via INTRAVENOUS
  Filled 2019-01-03: qty 100

## 2019-01-03 MED ORDER — STROKE: EARLY STAGES OF RECOVERY BOOK
Freq: Once | Status: AC
  Administered 2019-01-04
  Filled 2019-01-03: qty 1

## 2019-01-03 MED ORDER — LORAZEPAM 2 MG/ML IJ SOLN
1.0000 mg | Freq: Once | INTRAMUSCULAR | Status: AC
  Administered 2019-01-03: 1 mg via INTRAVENOUS
  Filled 2019-01-03: qty 1

## 2019-01-03 MED ORDER — SODIUM CHLORIDE 0.9 % IV SOLN
INTRAVENOUS | Status: DC | PRN
  Administered 2019-01-03 – 2019-01-04 (×2): 1000 mL via INTRAVENOUS

## 2019-01-03 MED ORDER — IOHEXOL 350 MG/ML SOLN
75.0000 mL | Freq: Once | INTRAVENOUS | Status: AC | PRN
  Administered 2019-01-03: 75 mL via INTRAVENOUS

## 2019-01-03 MED ORDER — POTASSIUM & SODIUM PHOSPHATES 280-160-250 MG PO PACK
1.0000 | PACK | Freq: Three times a day (TID) | ORAL | Status: DC
  Filled 2019-01-03 (×12): qty 1

## 2019-01-03 MED ORDER — LORAZEPAM 2 MG/ML IJ SOLN
INTRAMUSCULAR | Status: AC
  Filled 2019-01-03: qty 1

## 2019-01-03 MED ORDER — DIPHENHYDRAMINE HCL 25 MG PO CAPS
25.0000 mg | ORAL_CAPSULE | Freq: Four times a day (QID) | ORAL | Status: DC | PRN
  Administered 2019-01-03: 25 mg via ORAL
  Filled 2019-01-03: qty 1

## 2019-01-03 MED ORDER — SODIUM CHLORIDE 0.9 % IV SOLN
50.0000 mL | Freq: Once | INTRAVENOUS | Status: AC
  Administered 2019-01-03: 50 mL via INTRAVENOUS

## 2019-01-03 MED ORDER — ALTEPLASE (STROKE) FULL DOSE INFUSION: 0.9000 mg/kg | Freq: Once | INTRAVENOUS | Status: DC

## 2019-01-03 MED ORDER — SODIUM CHLORIDE 0.9 % IV SOLN: 50.0000 mL | Freq: Once | INTRAVENOUS | Status: DC

## 2019-01-03 NOTE — Plan of Care (Signed)
Pt educated and redirected several times this shift. Interverntions preformed throughout shift for dyspnea and abnormal saturations. Pt cooperative with care as far as medication and adls. Pt incontinent, cleaned several times this shift as well as repositioned. Pt has support system at home, daughter updated of pt status.

## 2019-01-03 NOTE — Consult Note (Signed)
Referring Physician: Dr. Jerral Ralph    Chief Complaint: Acute onset of weakness on the left side  HPI: Jermaine Wright is an 85 y.o. male who was admitted to [facility] on 12/8 for Covid PNA after presenting with generalized weakness. He is now exhibiting stroke-like symptoms of left hemineglect, left sided weakness, rightward gaze deviation. LKN was 3:20 PM. Neurology was called for STAT evaluation of stroke versus ICH. Currently being treated for Covid PNA in an ICU setting. He is on ASA but not on an anticoagulant. He has no history of intracranial hemorrhage and no recent bleeding, MI, head trauma or major surgery. BP is not severely elevated. His mRS is 0 at home based on discussion over the telephone with his daughter.   Stroke risk factors include Covid infection and CAD.   LSN: 3:20 PM tPA Given: Yes  Past Medical History:  Diagnosis Date  . AAA (abdominal aortic aneurysm) (HCC)   . Arthritis   . Asthma   . BPH with elevated PSA   . CAD (coronary artery disease)    Prior MI with CABG in 2010  . COPD (chronic obstructive pulmonary disease) (HCC)   . H/O: asbestos exposure   . Hemidiaphragm paralysis    RIGHT HEMIDIAPHRAGM  . Herpes 02/2008  . LV dysfunction    EF 55% per echo in 8/12  . OA (osteoarthritis)   . SOB (shortness of breath)     Past Surgical History:  Procedure Laterality Date  . CARDIAC CATHETERIZATION  06/17/2008   THERE WAS INCOMPLETE FILLING OF THE VENTRICLE, EF 40% WITH VERY MILD ANTERIOR HYPOKINESIS  . CORONARY ARTERY BYPASS GRAFT  06/2008   FOR CLASS IV UNSTABLE ANGINA WITH SUBENDOCARDIAL INFARCTION  . HERNIA REPAIR  1954  . HYDROCELE EXCISION  07/11/2011   Procedure: HYDROCELECTOMY ADULT;  Surgeon: Marcine Matar, MD;  Location: [facility];  Service: Urology;  Laterality: Left;     . US ECHOCARDIOGRAPHY  09/03/2008   EF 40-45%    Family History  Problem Relation Age of Onset  . Heart attack Mother   . Aneurysm Mother        AAA   Social History:  reports  that he quit smoking about 33 years ago. His smoking use included cigarettes. He has a 44.00 pack-year smoking history. He has never used smokeless tobacco. He reports that he does not drink alcohol or use drugs.  Allergies:  Allergies  Allergen Reactions  . Chlorhexidine Gluconate [Chlorhexidine] Rash    Medications:  Scheduled: . albuterol  2 puff Inhalation Q6H  . aspirin EC  81 mg Oral Daily  . atorvastatin  20 mg Oral Daily  . dexamethasone (DECADRON) injection  6 mg Intravenous Q24H  . enoxaparin (LOVENOX) injection  40 mg Subcutaneous Q24H  . umeclidinium bromide  1 puff Inhalation Daily   And  . fluticasone furoate-vilanterol  1 puff Inhalation Daily  . LORazepam  1 mg Intravenous Once  . metoprolol tartrate  25 mg Oral BID  . multivitamin with minerals  1 tablet Oral Daily  . omega-3 acid ethyl esters  1 g Oral BID  . potassium & sodium phosphates  1 packet Oral TID WC & HS  . vitamin C  500 mg Oral Daily  . zinc sulfate  220 mg Oral Daily   Continuous: . cefTRIAXone (ROCEPHIN)  IV Stopped (01/03/19 1610)  . remdesivir 100 mg in NS 100 mL 100 mg (01/03/19 1007)    ROS: Unable to obtain comprehensive ROS due to  patient being disoriented.   Physical Examination: Blood pressure 136/73, pulse 68, temperature 98.4 F (36.9 C), temperature source Axillary, resp. rate 16, height 5\' 8"  (1.727 m), weight 78 kg, SpO2 95 %.  HEENT: [place]/AT Ext: No amputations noted.   Neurologic Examination: Neurological examination performed via Teleconferencing as a Teleneurology consult, with assistance of Dr. Sloan Leiter and nursing staff at [facility] Mental Status: Awake and alert. Left hemineglect noted. Speech is fluent with intact comprehension of basic commands and intact naming of common objects. Oriented to the county, but not the state or city. Not oriented to month or year ("October", "1985"). Unable to state the day of the week.  Cranial Nerves: II:  Visual fields with left sided field cut.  PERRL.  III,IV, VI: Right sided gaze deviation. Unable to track to the left.  VII: No facial droop.  VIII: hearing intact to commands XI: Head preferentially rotated to the right.  XII: midline tongue extension  Motor: LUE and LLE drop to the bed immediately after passive elevation and release.  RUE with normal movement. Not following commands for detailed strength assessment.  RLE with 4-/5 strength. Can elevate but gradually drifts back to bed.  Sensory: Intact to noxious stimuli all 4 limbs Deep Tendon Reflexes:  Hypoactive upper extremity reflexes Cerebellar: No ataxia with FNF on right. Unable to perform on the left.  Gait: Deferred  Results for orders placed or performed during the hospital encounter of 01/11/2019 (from the past 48 hour(s))  ABO/Rh     Status: None   Collection Time: 01/02/19  5:40 AM  Result Value Ref Range   ABO/RH(D)      Jenetta Downer NEG Performed at [facility] [address], [place], [place] [address]   CBC with Differential/Platelet     Status: Abnormal   Collection Time: 01/02/19  5:45 AM  Result Value Ref Range   WBC 6.4 4.0 - 10.5 K/uL   RBC 5.16 4.22 - 5.81 MIL/uL   Hemoglobin 14.7 13.0 - 17.0 g/dL   HCT 46.6 39.0 - 52.0 %   MCV 90.3 80.0 - 100.0 fL   MCH 28.5 26.0 - 34.0 pg   MCHC 31.5 30.0 - 36.0 g/dL   RDW 14.6 11.5 - 15.5 %   Platelets 121 (L) 150 - 400 K/uL   nRBC 0.0 0.0 - 0.2 %   Neutrophils Relative % 77 %   Neutro Abs 5.0 1.7 - 7.7 K/uL   Lymphocytes Relative 14 %   Lymphs Abs 0.9 0.7 - 4.0 K/uL   Monocytes Relative 8 %   Monocytes Absolute 0.5 0.1 - 1.0 K/uL   Eosinophils Relative 0 %   Eosinophils Absolute 0.0 0.0 - 0.5 K/uL   Basophils Relative 0 %   Basophils Absolute 0.0 0.0 - 0.1 K/uL   Immature Granulocytes 1 %   Abs Immature Granulocytes 0.03 0.00 - 0.07 K/uL    Comment: Performed at [facility], [place] [address] [place], [place] [address]  Magnesium     Status: None   Collection Time:  01/02/19  5:45 AM  Result Value Ref Range   Magnesium 2.4 1.7 - 2.4 mg/dL    Comment: Performed at [facility], [place] [address] [place],  [address]  Phosphorus     Status: None   Collection Time: 01/02/19  5:45 AM  Result Value Ref Range   Phosphorus 3.4 2.5 - 4.6 mg/dL    Comment: Performed at [facility], [place]  [address] [place], [place] [address]  Ferritin     Status: Abnormal   Collection Time: 01/02/19  5:45 AM  Result Value Ref Range   Ferritin 1,272 (H) 24 - 336 ng/mL    Comment: Performed at [facility], [address], [place], [place] [address]  D-dimer, quantitative (not at [facility])     Status: Abnormal   Collection Time: 01/02/19  5:45 AM  Result Value Ref Range   D-Dimer, Quant 2.19 (H) 0.00 - 0.50 ug/mL-FEU    Comment: (NOTE) At the manufacturer cut-off of 0.50 ug/mL FEU, this assay has been documented to exclude PE with a sensitivity and negative predictive value of 97 to 99%.  At this time, this assay has not been approved by the FDA to exclude DVT/VTE. Results should be correlated with clinical presentation. Performed at [facility], [address] [place], [place] [address]   C-reactive protein     Status: Abnormal   Collection Time: 01/02/19  5:45 AM  Result Value Ref Range   CRP 9.7 (H) <1.0 mg/dL    Comment: Performed at [facility], [address], [place], [place] [address]  Procalcitonin     Status: None   Collection Time: 01/02/19  5:45 AM  Result Value Ref Range   Procalcitonin <0.10 ng/mL    Comment:        Interpretation: PCT (Procalcitonin) <= 0.5 ng/mL: Systemic infection (sepsis) is not likely. Local bacterial infection is possible. (NOTE)       Sepsis PCT Algorithm           Lower Respiratory Tract                                      Infection PCT Algorithm    ----------------------------     ----------------------------         PCT <  0.25 ng/mL                PCT < 0.10 ng/mL         Strongly encourage             Strongly discourage   discontinuation of antibiotics    initiation of antibiotics    ----------------------------     -----------------------------       PCT 0.25 - 0.50 ng/mL            PCT 0.10 - 0.25 ng/mL               OR       >80% decrease in PCT            Discourage initiation of                                            antibiotics      Encourage discontinuation           of antibiotics    ----------------------------     -----------------------------         PCT >= 0.50 ng/mL              PCT 0.26 - 0.50 ng/mL               AND        <80% decrease in PCT  Encourage initiation of                                             antibiotics       Encourage continuation           of antibiotics    ----------------------------     -----------------------------        PCT >= 0.50 ng/mL                  PCT > 0.50 ng/mL               AND         increase in PCT                  Strongly encourage                                      initiation of antibiotics    Strongly encourage escalation           of antibiotics                                     -----------------------------                                           PCT <= 0.25 ng/mL                                                 OR                                        > 80% decrease in PCT                                     Discontinue / Do not initiate                                             antibiotics Performed at [facility], [address] [place], [place] [address]   Comprehensive metabolic panel     Status: Abnormal   Collection Time: 01/02/19  5:45 AM  Result Value Ref Range   Sodium 143 135 - 145 mmol/L   Potassium 4.4 3.5 - 5.1 mmol/L   Chloride 107 98 - 111 mmol/L   CO2 26 22 - 32 mmol/L   Glucose, Bld 127 (H) 70 - 99 mg/dL   BUN 27 (H) 8 - 23 mg/dL   Creatinine, Ser 6.04 0.61 - 1.24  mg/dL   Calcium 8.4 (L) 8.9 - 10.3 mg/dL   Total Protein 6.3 (L) 6.5 - 8.1 g/dL   Albumin 3.3 (L) 3.5 - 5.0 g/dL  AST 51 (H) 15 - 41 U/L   ALT 20 0 - 44 U/L   Alkaline Phosphatase 57 38 - 126 U/L   Total Bilirubin 1.1 0.3 - 1.2 mg/dL   GFR calc non Af Amer >60 >60 mL/min   GFR calc Af Amer >60 >60 mL/min   Anion gap 10 5 - 15    Comment: Performed at [facility], [address] [place], [place] [address]  MRSA PCR Screening     Status: None   Collection Time: 01/02/19  6:20 AM   Specimen: Nasopharyngeal  Result Value Ref Range   MRSA by PCR NEGATIVE NEGATIVE    Comment:        The GeneXpert MRSA Assay (FDA approved for NASAL specimens only), is one component of a comprehensive MRSA colonization surveillance program. It is not intended to diagnose MRSA infection nor to guide or monitor treatment for MRSA infections. Performed at [facility], [address] [place], [place] [address]   Procalcitonin     Status: None   Collection Time: 01/03/19  4:05 AM  Result Value Ref Range   Procalcitonin <0.10 ng/mL    Comment:        Interpretation: PCT (Procalcitonin) <= 0.5 ng/mL: Systemic infection (sepsis) is not likely. Local bacterial infection is possible. (NOTE)       Sepsis PCT Algorithm           Lower Respiratory Tract                                      Infection PCT Algorithm    ----------------------------     ----------------------------         PCT < 0.25 ng/mL                PCT < 0.10 ng/mL         Strongly encourage             Strongly discourage   discontinuation of antibiotics    initiation of antibiotics    ----------------------------     -----------------------------       PCT 0.25 - 0.50 ng/mL            PCT 0.10 - 0.25 ng/mL               OR       >80% decrease in PCT            Discourage initiation of                                            antibiotics      Encourage discontinuation           of  antibiotics    ----------------------------     -----------------------------         PCT >= 0.50 ng/mL              PCT 0.26 - 0.50 ng/mL               AND        <80% decrease in PCT             Encourage initiation of  antibiotics       Encourage continuation           of antibiotics    ----------------------------     -----------------------------        PCT >= 0.50 ng/mL                  PCT > 0.50 ng/mL               AND         increase in PCT                  Strongly encourage                                      initiation of antibiotics    Strongly encourage escalation           of antibiotics                                     -----------------------------                                           PCT <= 0.25 ng/mL                                                 OR                                        > 80% decrease in PCT                                     Discontinue / Do not initiate                                             antibiotics Performed at [facility], [address] [place], [place] [address]   CBC with Differential/Platelet     Status: None   Collection Time: 01/03/19  4:05 AM  Result Value Ref Range   WBC 7.6 4.0 - 10.5 K/uL   RBC 5.27 4.22 - 5.81 MIL/uL   Hemoglobin 14.9 13.0 - 17.0 g/dL   HCT 95.2 84.1 - 32.4 %   MCV 89.0 80.0 - 100.0 fL   MCH 28.3 26.0 - 34.0 pg   MCHC 31.8 30.0 - 36.0 g/dL   RDW 40.1 02.7 - 25.3 %   Platelets 167 150 - 400 K/uL   nRBC 0.0 0.0 - 0.2 %   Neutrophils Relative % 83 %   Neutro Abs 6.2 1.7 - 7.7 K/uL   Lymphocytes Relative 12 %   Lymphs Abs 0.9 0.7 - 4.0 K/uL   Monocytes Relative 5 %   Monocytes Absolute 0.4 0.1 - 1.0 K/uL   Eosinophils Relative 0 %   Eosinophils Absolute  0.0 0.0 - 0.5 K/uL   Basophils Relative 0 %   Basophils Absolute 0.0 0.0 - 0.1 K/uL   Immature Granulocytes 0 %   Abs Immature Granulocytes 0.03 0.00 - 0.07 K/uL     Comment: Performed at [facility], [address] [place], [place] [address]  Magnesium     Status: None   Collection Time: 01/03/19  4:05 AM  Result Value Ref Range   Magnesium 2.2 1.7 - 2.4 mg/dL    Comment: Performed at [facility], [address], [place], [place] [address]  Phosphorus     Status: Abnormal   Collection Time: 01/03/19  4:05 AM  Result Value Ref Range   Phosphorus 2.3 (L) 2.5 - 4.6 mg/dL    Comment: Performed at [facility], [address] [place], [place] [address]  Ferritin     Status: Abnormal   Collection Time: 01/03/19  4:05 AM  Result Value Ref Range   Ferritin 1,368 (H) 24 - 336 ng/mL    Comment: Performed at [facility], [address], [place], [place] [address]  D-dimer, quantitative (not at [facility])     Status: Abnormal   Collection Time: 01/03/19  4:05 AM  Result Value Ref Range   D-Dimer, Quant 2.29 (H) 0.00 - 0.50 ug/mL-FEU    Comment: (NOTE) At the manufacturer cut-off of 0.50 ug/mL FEU, this assay has been documented to exclude PE with a sensitivity and negative predictive value of 97 to 99%.  At this time, this assay has not been approved by the FDA to exclude DVT/VTE. Results should be correlated with clinical presentation. Performed at [facility], [address] [place], [place] [address]   C-reactive protein     Status: Abnormal   Collection Time: 01/03/19  4:05 AM  Result Value Ref Range   CRP 7.2 (H) <1.0 mg/dL    Comment: Performed at [facility], [address], [place], [place] [address]   ECHOCARDIOGRAM COMPLETE  Result Date: 01/02/2019   ECHOCARDIOGRAM REPORT   Patient Name:   Jermaine Wright Date of Exam: 01/02/2019 Medical Rec #:  [id number]      Height:       68.0 in Accession #:    [id number]     Weight:       172.0 lb Date of Birth:  01/25/1928      BSA:          1.92 m Patient Age:    89 years       BP:            93/55 mmHg Patient Gender: M              HR:           64 bpm. Exam Location:  Inpatient Procedure: 2D Echo, Cardiac Doppler and Color Doppler Indications:    I48.91* Unspeicified atrial fibrillation  History:        Patient has prior history of Echocardiogram examinations, most                 recent 09/17/2010. CAD, Abnormal ECG; Arrythmias:Atrial                 Fibrillation. Covid positive. Pneumonia. LV dysfunction. ETOH.  Sonographer:    Sheralyn Boatman RDCS Referring Phys: [id number] ARAVIND CHANDRA  Sonographer Comments: Technically difficult study due to poor echo windows. In chair. Study was extremely difficult because patient was  in chair. IMPRESSIONS  1. Left ventricular ejection fraction, by visual estimation, is 40 to 45%. The left ventricle has mildly decreased function. There is no left ventricular hypertrophy.  2. The left ventricle demonstrates global hypokinesis.  3. Global right ventricle has mildly reduced systolic function.The right ventricular size is normal. No increase in right ventricular wall thickness.  4. Left atrial size was normal.  5. Right atrial size was normal.  6. Presence of pericardial fat pad.  7. The mitral valve is degenerative. Mild mitral valve regurgitation.  8. The tricuspid valve is grossly normal. Tricuspid valve regurgitation is trivial.  9. The aortic valve is tricuspid. Aortic valve regurgitation is not visualized. Mild to moderate aortic valve sclerosis/calcification without any evidence of aortic stenosis. 10. The pulmonic valve was grossly normal. Pulmonic valve regurgitation is not visualized. 11. TR signal is inadequate for assessing pulmonary artery systolic pressure. 12. No prior Echocardiogram. FINDINGS  Left Ventricle: Left ventricular ejection fraction, by visual estimation, is 40 to 45%. The left ventricle has mildly decreased function. The left ventricle demonstrates global hypokinesis. The left ventricular internal cavity size was the left ventricle is normal in  size. There is no left ventricular hypertrophy. The left ventricular diastology could not be evaluated due to nondiagnostic images. Right Ventricle: The right ventricular size is normal. No increase in right ventricular wall thickness. Global RV systolic function is has mildly reduced systolic function. Left Atrium: Left atrial size was normal in size. Right Atrium: Right atrial size was normal in size Pericardium: There is no evidence of pericardial effusion. Presence of pericardial fat pad. Mitral Valve: The mitral valve is degenerative in appearance. Mild mitral valve regurgitation. Tricuspid Valve: The tricuspid valve is grossly normal. Tricuspid valve regurgitation is trivial. Aortic Valve: The aortic valve is tricuspid. Aortic valve regurgitation is not visualized. Mild to moderate aortic valve sclerosis/calcification is present, without any evidence of aortic stenosis. Pulmonic Valve: The pulmonic valve was grossly normal. Pulmonic valve regurgitation is not visualized. Pulmonic regurgitation is not visualized. Aorta: The aortic root is normal in size and structure. Venous: The inferior vena cava was not well visualized. IAS/Shunts: No atrial level shunt detected by color flow Doppler.  LEFT VENTRICLE PLAX 2D LVIDd:         4.84 cm LVIDs:         4.23 cm LV PW:         1.15 cm LV IVS:        1.02 cm LVOT diam:     2.40 cm LV SV:         30 ml LV SV Index:   15.26 LVOT Area:     4.52 cm  LV Volumes (MOD) LV area d, A2C:    16.40 cm LV area d, A4C:    24.30 cm LV area s, A2C:    12.50 cm LV area s, A4C:    19.30 cm LV major d, A2C:   4.91 cm LV major d, A4C:   6.33 cm LV major s, A2C:   4.83 cm LV major s, A4C:   5.61 cm LV vol d, MOD A2C: 45.5 ml LV vol d, MOD A4C: 76.3 ml LV vol s, MOD A2C: 29.7 ml LV vol s, MOD A4C: 57.4 ml LV SV MOD A2C:     15.8 ml LV SV MOD A4C:     76.3 ml LV SV MOD BP:      22.7 ml RIGHT VENTRICLE RV S prime:     11.50  cm/s TAPSE (M-mode): 1.2 cm LEFT ATRIUM             Index        RIGHT ATRIUM           Index LA diam:        4.20 cm 2.19 cm/m  RA Area:     14.20 cm LA Vol (A2C):   45.4 ml 23.68 ml/m RA Volume:   27.90 ml  14.56 ml/m LA Vol (A4C):   57.6 ml 30.05 ml/m LA Biplane Vol: 54.1 ml 28.22 ml/m  AORTIC VALVE LVOT Vmax:   83.50 cm/s LVOT Vmean:  56.800 cm/s LVOT VTI:    0.159 m  AORTA Ao Root diam: 3.20 cm Ao Asc diam:  3.30 cm MITRAL VALVE MV Area (PHT): 2.83 cm             SHUNTS MV PHT:        77.72 msec           Systemic VTI:  0.16 m MV Decel Time: 268 msec             Systemic Diam: 2.40 cm MV E velocity: 52.90 cm/s 103 cm/s MV A velocity: 67.90 cm/s 70.3 cm/s MV E/A ratio:  0.78       1.5  Lennie Odor MD Electronically signed by Lennie Odor MD Signature Date/Time: 01/02/2019/4:49:17 PM    Final     Assessment: 85 y.o. male with acute onset of left hemiplegia, right gaze deviation and left sided neglect 1. Exam findings are most consistent with an acute right MCA territory stroke 2. CT head indeterminate due to movement 3. CTA head/neck shows severe right ICA origin calcific/atherosclerotic stenosis but no LVO. No hemorrhage seen.   4. Stroke Risk Factors - Covid infection and CAD. 5. Recent TTE report describes no mural thrombus. Left ventricular ejection fraction, by visual estimation, is 40 to 45%. The left ventricle demonstrates global hypokinesis.    Recommendations: 1. The patient has no absolute contraindications to tPA and is a tPA candidate. Full risks/benefits discussed with his daughter Randel Books over the telephone. The daughter expressed understanding and wish to proceed with tPA. Consent witnessed by Cathrine Muster, NP.  2. Is being transferred to the ICU under the CCM team due to Covid status 3. MRI of the brain without contrast when stable 4. Cardiac telemetry.  5. Will need carotid ultrasound and Vascular Surgery consult for possible right CEA given symptomatic severe stenosis.  6. PT consult, OT consult, Speech consult 7. No  antiplatelet agents or anticoagulants for 24 hours after tPA. Can restart ASA and DVT chemoprophylaxis if repeat CT at 24 hours is negative for hemorrhage. DVT prophylaxis with SCDs for now.  8. High dose statin if no contraindications from a medical standpoint.  9. Post-tPA BP management with SBP to be maintained below 180 and DBP below 105 10. Risk factor modification 11. Frequent neuro checks 12. HgbA1c, fasting lipid panel   @Electronically  signed: Dr. Caryl Pina  01/03/2019, 4:10 PM

## 2019-01-03 NOTE — Progress Notes (Signed)
Pt o2 sats averaging 83-89%. Oxygen methods titrated as needed, writer also repositioned pt as needed as well to promote adequate breathing

## 2019-01-03 NOTE — Progress Notes (Signed)
Pharmacist Code Stroke Response  Notified to mix tPA at 1822 by Dr. Cheral Marker Delivered tPA to RN at 1830  tPA dose = 7mg  bolus over 1 minute followed by 63.2mg  for a total dose of 70.2mg  over 1 hour  Onnie Boer, PharmD, [place], AAHIVP, CPP Infectious Disease Pharmacist 01/03/2019 6:37 PM

## 2019-01-03 NOTE — Progress Notes (Signed)
Pt admitted to 4NICU at [place] [facility] via [facility]. Globally aphasic, not following commands, flaccid on the LUE with weak nonpurposeful movement of the LLE. Frequent restless movements of right extremities. Eyes deviated to the right, pupils sluggish but reactive.   On high flow nasal cannula, no respiratory distress.   Daughter, Brunetta Genera, called and updated on pt's arrival to [facility], condition, and plan of care.

## 2019-01-03 NOTE — Progress Notes (Signed)
Physical Therapy Treatment Patient Details Name: Jermaine Wright MRN: [id number] DOB: 20-Jul-1928 Today's Date: 01/03/2019    History of Present Illness Pt is an 86 y.o. male admitted 01/11/2019 with weakness, fatigue and SOB; tested (+) COVID-19. 12/10 pt with new onset R-gaze deviation and L-side weakness, code stroke called; awaiting imaging results. PMH includes afib, CAD, COPD, OA, asthma.   PT Comments    Pt with worsening cognition and new onset deficits this session, including R-side gaze deviation and L-side weakness (only noted LLE twitching and 1x L elbow flexion). Unable to stand despite maxA+2, requiring totalA to transfer to bed. Pt responding to name, but increased confusion and restlessness. SpO2 90% on 12L O2 HFNC, BP 141/84. MD and RN aware of new onset deficits, code stroke called.    Follow Up Recommendations  SNF;Supervision/Assistance - 24 hour     Equipment Recommendations  (TBD)    Recommendations for Other Services       Precautions / Restrictions Precautions Precautions: Fall;Other (comment) Precaution Comments: New onset L-side weakness, R-size gaze deviation; bilateral soft mitts Restrictions Weight Bearing Restrictions: No    Mobility  Bed Mobility Overal bed mobility: Needs Assistance Bed Mobility: Sit to Supine       Sit to supine: Total assist;+2 for physical assistance;+2 for safety/equipment      Transfers Overall transfer level: Needs assistance Equipment used: None Transfers: Squat Pivot Transfers     Squat pivot transfers: Total assist;+2 physical assistance;+2 safety/equipment     General transfer comment: Attempted sit<>stand from chair with maxA+2, pt unable to achieve fully upright standing, bilateral knee buckling and actively resisting with R-side, clearly confused; totalA to transfer to bed  Ambulation/Gait                 Stairs             Wheelchair Mobility    Modified Rankin (Stroke Patients Only)        Balance Overall balance assessment: Needs assistance   Sitting balance-Leahy Scale: Zero       Standing balance-Leahy Scale: Zero                              Cognition Arousal/Alertness: Awake/alert;Lethargic Behavior During Therapy: Flat affect;Restless;Agitated Overall Cognitive Status: Impaired/Different from baseline Area of Impairment: Orientation;Attention;Following commands;Safety/judgement;Awareness;Problem solving;Memory                 Orientation Level: Disoriented to;Situation;Place;Time Current Attention Level: Focused Memory: Decreased short-term memory Following Commands: Follows one step commands inconsistently Safety/Judgement: Decreased awareness of deficits;Decreased awareness of safety Awareness: Intellectual Problem Solving: Slow processing;Difficulty sequencing;Requires verbal cues;Decreased initiation;Requires tactile cues General Comments: Cognition worse than yesterday's PT session and this morning's OT session. Pt reponding to name but L-side inattention noted, pt will look past midline with max cues but difficulty tracking and reverting back to R-side gaze      Exercises      General Comments General comments (skin integrity, edema, etc.): New onset deficits noted, RN and MD present and code stroke called and pt transferred to CT      Pertinent Vitals/Pain Pain Assessment: Faces Faces Pain Scale: Hurts little more Pain Location: Grimacing and c/o pain with L shoulder motion; reponds to pain in LUE/LLE Pain Descriptors / Indicators: Grimacing Pain Intervention(s): Monitored during session    Home Living  Prior Function            PT Goals (current goals can now be found in the care plan section) Progress towards PT goals: Not progressing toward goals - comment(new onset deficits)    Frequency    Min 2X/week      PT Plan Discharge plan needs to be updated;Frequency needs to be  updated    Co-evaluation              AM-PAC PT "6 Clicks" Mobility   Outcome Measure  Help needed turning from your back to your side while in a flat bed without using bedrails?: Total Help needed moving from lying on your back to sitting on the side of a flat bed without using bedrails?: Total Help needed moving to and from a bed to a chair (including a wheelchair)?: Total Help needed standing up from a chair using your arms (e.g., wheelchair or bedside chair)?: Total   Help needed climbing 3-5 steps with a railing? : Total 6 Click Score: 5    End of Session Equipment Utilized During Treatment: Oxygen Activity Tolerance: Treatment limited secondary to medical complications (Comment) Patient left: in bed;with call bell/phone within reach;with bed alarm set;with nursing/sitter in room Nurse Communication: Mobility status PT Visit Diagnosis: Other abnormalities of gait and mobility (R26.89);Muscle weakness (generalized) (M62.81)     Time: 2774-1287 PT Time Calculation (min) (ACUTE ONLY): 35 min  Charges:  $Therapeutic Activity: 8-22 mins                     Ina Homes, PT, DPT Acute Rehabilitation Services  Pager [phone] Office [phone]  Malachy Chamber 01/03/2019, 5:29 PM

## 2019-01-03 NOTE — Progress Notes (Signed)
Pt awake all night pulling at lines. Redirected several times. desatting due to pt mostly taking off o2. Engineer, production. Paged md for prn sleep aide.

## 2019-01-03 NOTE — Evaluation (Signed)
Occupational Therapy Evaluation Patient Details Name: Jermaine Wright MRN: [id number] DOB: 12/21/1928 Today's Date: 01/03/2019    History of Present Illness Pt is an 85 y.o. male admitted 01/03/2019 with weakness, fatigue and SOB; tested (+) COVID-19. PMH includes afib, CAD, COPD, OA, asthma.   Clinical Impression   Patient was mod I with walking stick for mobility and supervision for bathing/dressing, would help out with food prep. Today Pt is requiring increased O2 from yesterday. He is on 15L via HFNC throughout session, with drops down into the high 70's. With cues to breathe through his nose it improves to high 80's low 90's. At beginning of session, NT performing bath, assisted with completion as well as sit<>stand with mod A face to face for chair repositioning. Pt with strong posterior lean requiring Bil knee blocking. AFter good chair positioning. Pt participated in seat level grooming (he really enjoyed brushing his gums) he required verbal and physical cues for task initiation. At this time recommending post-acute care at SNF to maximize safety and independence in ADL and functional transfers as well cognition.     Follow Up Recommendations  SNF;Supervision/Assistance - 24 hour    Equipment Recommendations  Other (comment)(defer to next venue)    Recommendations for Other Services       Precautions / Restrictions Precautions Precautions: Fall Restrictions Weight Bearing Restrictions: No      Mobility Bed Mobility               General bed mobility comments: OOB in recliner at beginning and end of session  Transfers Overall transfer level: Needs assistance Equipment used: 1 person hand held assist Transfers: Sit to/from Stand Sit to Stand: Mod assist         General transfer comment: ModA to assist trunk elevation and maintain balance with HHA, blocking both knees, strong posterior lean    Balance Overall balance assessment: Needs assistance   Sitting  balance-Leahy Scale: Fair       Standing balance-Leahy Scale: Poor Standing balance comment: dependent on external support                           ADL either performed or assessed with clinical judgement   ADL Overall ADL's : Needs assistance/impaired Eating/Feeding: Minimal assistance;Sitting   Grooming: Minimal assistance;Sitting;Oral care;Wash/dry face Grooming Details (indicate cue type and reason): physical cues to initiate task Upper Body Bathing: Maximal assistance;Sitting   Lower Body Bathing: Maximal assistance;Sit to/from stand   Upper Body Dressing : Moderate assistance;Sitting Upper Body Dressing Details (indicate cue type and reason): to don gown after bath Lower Body Dressing: Maximal assistance   Toilet Transfer: Moderate assistance;Stand-pivot   Toileting- Clothing Manipulation and Hygiene: Maximal assistance;Sit to/from stand       Functional mobility during ADLs: Maximal assistance General ADL Comments: cues for task initiation, cues to breathe through nose for constant desaturation     Vision         Perception     Praxis      Pertinent Vitals/Pain Pain Assessment: No/denies pain     Hand Dominance Right   Extremity/Trunk Assessment Upper Extremity Assessment Upper Extremity Assessment: Generalized weakness   Lower Extremity Assessment Lower Extremity Assessment: Generalized weakness   Cervical / Trunk Assessment Cervical / Trunk Assessment: Kyphotic   Communication Communication Communication: HOH   Cognition Arousal/Alertness: Awake/alert Behavior During Therapy: WFL for tasks assessed/performed Overall Cognitive Status: Impaired/Different from baseline Area of Impairment: Orientation;Attention;Following commands;Safety/judgement;Awareness;Problem solving;Memory  Orientation Level: Disoriented to;Situation Current Attention Level: Sustained;Selective Memory: Decreased short-term  memory Following Commands: Follows one step commands consistently;Follows one step commands with increased time;Follows multi-step commands inconsistently;Follows multi-step commands with increased time Safety/Judgement: Decreased awareness of deficits;Decreased awareness of safety Awareness: Emergent Problem Solving: Slow processing;Difficulty sequencing;Requires verbal cues General Comments: Answering some orientation questions initially wrong but able to correct without cues (seems more related to slowed processing); A&O to self/birthday, current date, location as [facility] (reoriented to [facility] and pt aware of old [place]), and admitted because he was feeling sick (told about COVID and pt states "I know"). Good sense of humor and following commands appropriately, sometimes with increased time   General Comments       Exercises     Shoulder Instructions      Home Living Family/patient expects to be discharged to:: Private residence Living Arrangements: Children;Other relatives Available Help at Discharge: Family;Available 24 hours/day Type of Home: House       Home Layout: Two level;Bed/bath upstairs     Bathroom Shower/Tub: Teacher, early years/pre: Standard     Home Equipment: Other (comment)(walking stick)          Prior Functioning/Environment Level of Independence: Needs assistance  Gait / Transfers Assistance Needed: Pt reports mod indep using walking stick. Family available for assist as needed ADL's / Homemaking Assistance Needed: Pt and daughter report family always provides supervision for bathing and toileting. Family cooks meals (pt assists with food prep as able)   Comments: Does not wear home O2, but uses inhaler; endorses long h/o tobacco use        OT Problem List: Decreased activity tolerance;Decreased strength;Decreased range of motion;Decreased safety awareness;Decreased cognition;Decreased knowledge of use of DME or AE;Cardiopulmonary status  limiting activity      OT Treatment/Interventions: Self-care/ADL training;DME and/or AE instruction;Therapeutic activities;Patient/family education;Balance training    OT Goals(Current goals can be found in the care plan section) Acute Rehab OT Goals Patient Stated Goal: none stated Time For Goal Achievement: 01/17/19 Potential to Achieve Goals: Good  OT Frequency: Min 2X/week   Barriers to D/C:            Co-evaluation              AM-PAC OT "6 Clicks" Daily Activity     Outcome Measure Help from another person eating meals?: A Lot Help from another person taking care of personal grooming?: A Lot Help from another person toileting, which includes using toliet, bedpan, or urinal?: A Lot Help from another person bathing (including washing, rinsing, drying)?: A Lot Help from another person to put on and taking off regular upper body clothing?: A Lot Help from another person to put on and taking off regular lower body clothing?: Total 6 Click Score: 11   End of Session Equipment Utilized During Treatment: Gait belt;Oxygen(15L via HFNC) Nurse Communication: Mobility status  Activity Tolerance: Patient tolerated treatment well Patient left: in chair;with chair alarm set(with tray across him)  OT Visit Diagnosis: Unsteadiness on feet (R26.81);Other abnormalities of gait and mobility (R26.89);Muscle weakness (generalized) (M62.81);Other symptoms and signs involving cognitive function                Time: 0867-6195 OT Time Calculation (min): 25 min Charges:     Hulda Humphrey OTR/L Acute Rehabilitation Services Pager: [phone] Office: [place] 01/03/2019, 1:45 PM

## 2019-01-03 NOTE — Progress Notes (Signed)
Significant event :  3:45 . A code stroke was called with right gaze preference and left UE not moving.  Last known normal 1/2 hour on routine rounds. Immediately attended patient.  Vitals:   01/03/19 0300 01/03/19 0413 01/03/19 0900 01/03/19 1140  BP: 118/81 109/77 136/73   Pulse:  70 81 68  Temp:  98 F (36.7 C) 98.4 F (36.9 C)   Resp:  18 (!) 23 16  Height:      Weight:      SpO2:   (!) 86% 95%  TempSrc:  Axillary Axillary   BMI (Calculated):       BP acceptable. Oxygen 91% on  12 liters , patient confused. Neuro exam: Focussed ; Right gaze preference. Pupils b/l equal and reactive  Confused, fluent . AAO x1 Possible left visual field cut. Tongue midline . Following commands  Right UE 5/5, punching Right LE 4/5, keeps transiently in aor Left UE 0/5 Left LE 2/5  A/P: Acute ischemic stroke with left hemiplegia UE>LE Code stroke attended. Tele Neuro assisted for evaluation.head CT ordered. Neurology to discuss with family for consent re;tPA Continue to attend patient   615 PM: After careful consideration, decision was made to give patient TPA. Discussed with critical care Dr. Lake Bells, neurology. Patient to receive TPA and transferred to ICU level of care at [facility] along with COVID-19 isolation precautions.

## 2019-01-03 NOTE — Progress Notes (Signed)
PROGRESS NOTE    Jermaine Wright  ZOX:[id number] DOB: 1928/05/21 DOA: 01/16/2019 PCP: Gaspar Garbe, MD    Brief Narrative:  Patient is 85 year old gentleman with history of coronary artery disease status post CABG, COPD, BPH, right hemidiaphragm paralysis and osteoarthritis who presented to the emergency room from home with generalized weakness.  Patient had not been feeling well since about 4 to 5 days, he was tired. In the emergency room, he was hypoxic and initially required 15 L via nonrebreather.  He was started on COVID-19 directed therapies and transferred to [facility].  Also found to have A. fib on monitor.   Assessment & Plan:   Active Problems:   Pneumonia due to COVID-19 virus  Pneumonia due to COVID-19 virus with acute hypoxemic respiratory failure in the setting of underlying COPD: Continue to monitor due to significant symptoms .  Patient is a still requiring significant amount of oxygen.  He does have underlying chronic lung disease,  chest physiotherapy, incentive spirometry, deep breathing exercises, sputum induction, mucolytic's and bronchodilators. Supplemental oxygen to keep saturations more than 88%. Covid directed therapy with , steroids, currently on dexamethasone continue. remdesivir, day 3/5 antibiotics , covered with Rocephin given abnormal urinalysis and COPD.  Continue for 3 days. Due to severity of symptoms, patient will need daily inflammatory markers, liver function test to monitor and direct COVID-19 therapies. With underlying lung disease, COPD, Covid pneumonia and elevated D-dimer, will check CTA of the chest to rule out pulmonary embolism.  New onset A. fib: Paroxysmal A. fib.  Transient.  Converted to sinus rhythm. Started on metoprolol.  TSH normal.  Echocardiogram shows ejection fraction 40 -45%.  Probably has undiagnosed underlying A. fib.  Unknown burden of A. fib.  We will continue to monitor in telemetry unit, if recurrence and  persistent, will start patient on NOAC and refer to cardiology.  If low burden, will send to cardiology for Holter monitoring.  Abnormal UA: UTI ruled out.  Asymptomatic.  Rocephin per #1.  CAD status post CABG: Continue aspirin atorvastatin and metoprolol.  Stable.  AAA: Surveillance with cardiology.  Stable.   DVT prophylaxis: Lovenox subcu Code Status: Full code Family Communication: Daughter , unable to pick up the phone or leave message. Disposition Plan: Home with 24/7 care versus skilled nursing facility after clinical improvement.   Consultants:   None  Procedures:   None  Antimicrobials:   Rocephin, 11/01/2018>>>  Remdesivir, 11/01/2018>>>   Subjective: Patient seen and examined.  Overnight he remained restless, oxygen requirement was fluctuating and also required up to 15 L of oxygen. Patient himself is poor historian.  He denies any complaints.  Objective: Vitals:   01/03/19 0300 01/03/19 0413 01/03/19 0900 01/03/19 1140  BP: 118/81 109/77 136/73   Pulse:  70 81 68  Resp:  18 (!) 23 16  Temp:  98 F (36.7 C) 98.4 F (36.9 C)   TempSrc:  Axillary Axillary   SpO2:   (!) 86% 95%  Weight:      Height:        Intake/Output Summary (Last 24 hours) at 01/03/2019 1314 Last data filed at 01/03/2019 1113 Gross per 24 hour  Intake 1110 ml  Output 750 ml  Net 360 ml   Filed Weights   01/24/2019 1412  Weight: 78 kg    Examination:  General exam: Appears calm and comfortable, chronically sick looking, not in any distress on high flow oxygen. Respiratory system: Clear to auscultation. Respiratory effort normal.  Mostly conducted airway sounds.  Patient was on 10 L oxygen on my evaluation. Cardiovascular system: S1 & S2 heard, RRR. No JVD, murmurs, rubs, gallops or clicks. No pedal edema. Gastrointestinal system: Abdomen is nondistended, soft and nontender. No organomegaly or masses felt. Normal bowel sounds heard. Central nervous system: Alert and oriented.  No focal neurological deficits. Extremities: Symmetric 5 x 5 power. Skin: No rashes, lesions or ulcers Psychiatry: Judgement and insight appear normal. Mood & affect appropriate.     Data Reviewed: I have personally reviewed following labs and imaging studies  CBC: Recent Labs  Lab 01/19/2019 1148 01/02/19 0545 01/03/19 0405  WBC 5.6 6.4 7.6  NEUTROABS 3.7 5.0 6.2  HGB 15.3 14.7 14.9  HCT 48.2 46.6 46.9  MCV 89.8 90.3 89.0  PLT 124* 121* 167   Basic Metabolic Panel: Recent Labs  Lab 12/26/2018 1148 01/02/19 0545 01/03/19 0405  NA 139 143  --   K 3.9 4.4  --   CL 105 107  --   CO2 23 26  --   GLUCOSE 94 127*  --   BUN 29* 27*  --   CREATININE 1.06 0.89  --   CALCIUM 8.2* 8.4*  --   MG  --  2.4 2.2  PHOS  --  3.4 2.3*   GFR: Estimated Creatinine Clearance: 55.5 mL/min (by C-G formula based on SCr of 0.89 mg/dL). Liver Function Tests: Recent Labs  Lab 01/12/2019 1148 01/02/19 0545  AST 58* 51*  ALT 21 20  ALKPHOS 60 57  BILITOT 1.2 1.1  PROT 6.9 6.3*  ALBUMIN 3.6 3.3*   No results for input(s): LIPASE, AMYLASE in the last 168 hours. No results for input(s): AMMONIA in the last 168 hours. Coagulation Profile: No results for input(s): INR, PROTIME in the last 168 hours. Cardiac Enzymes: No results for input(s): CKTOTAL, CKMB, CKMBINDEX, TROPONINI in the last 168 hours. BNP (last 3 results) No results for input(s): PROBNP in the last 8760 hours. HbA1C: No results for input(s): HGBA1C in the last 72 hours. CBG: No results for input(s): GLUCAP in the last 168 hours. Lipid Profile: Recent Labs    01/10/2019 1309  TRIG 81   Thyroid Function Tests: Recent Labs    01/05/2019 1309  TSH 1.745   Anemia Panel: Recent Labs    01/02/19 0545 01/03/19 0405  FERRITIN 1,272* 1,368*   Sepsis Labs: Recent Labs  Lab 01/15/2019 1148 01/09/2019 1309 01/02/19 0545 01/03/19 0405  PROCALCITON  --  <0.10 <0.10 <0.10  LATICACIDVEN 1.7  --   --   --     Recent Results  (from the past 240 hour(s))  Culture, blood (routine x 2)     Status: None (Preliminary result)   Collection Time: 01/12/2019 11:48 AM   Specimen: BLOOD LEFT HAND  Result Value Ref Range Status   Specimen Description   Final    BLOOD LEFT HAND Performed at [facility], [address] [place], [place] [address]    Special Requests   Final    BOTTLES DRAWN AEROBIC AND ANAEROBIC Blood Culture results may not be optimal due to an inadequate volume of blood received in culture bottles Performed at [facility], [address] [place], [place] [address]    Culture   Final    NO GROWTH 2 DAYS Performed at [facility], [address] [place], [place] [address]    Report Status PENDING  Incomplete  Urine culture  Status: None   Collection Time: 01/08/2019 11:49 AM   Specimen: Urine, Clean Catch  Result Value Ref Range Status   Specimen Description   Final    URINE, CLEAN CATCH Performed at [facility], [address] [place], [place] [address]    Special Requests   Final    NONE Performed at [facility], [address] [place], [place] [address]    Culture   Final    NO GROWTH Performed at [facility], [address], [place], [place] [address]    Report Status 01/02/2019 FINAL  Final  Culture, blood (routine x 2)     Status: None (Preliminary result)   Collection Time: 01/03/2019 11:52 AM   Specimen: BLOOD RIGHT HAND  Result Value Ref Range Status   Specimen Description   Final    BLOOD RIGHT HAND Performed at [facility], [address], [place], [place] [address]    Special Requests   Final    BOTTLES DRAWN AEROBIC AND ANAEROBIC Blood Culture results may not be optimal due to an inadequate volume of blood received in culture bottles Performed at [facility], [address] [place], [place] [address]    Culture   Final    NO GROWTH 2 DAYS  Performed at [facility], [address] [place], [place] [address]    Report Status PENDING  Incomplete  MRSA PCR Screening     Status: None   Collection Time: 01/02/19  6:20 AM   Specimen: Nasopharyngeal  Result Value Ref Range Status   MRSA by PCR NEGATIVE NEGATIVE Final    Comment:        The GeneXpert MRSA Assay (FDA approved for NASAL specimens only), is one component of a comprehensive MRSA colonization surveillance program. It is not intended to diagnose MRSA infection nor to guide or monitor treatment for MRSA infections. Performed at [facility], [address] [place], [place] [address]          Radiology Studies: [facility] Chest Port 1 View  Result Date: 01/07/2019 CLINICAL DATA:  Hypoxic, COVID positive EXAM: PORTABLE CHEST 1 VIEW COMPARISON:  Radiograph 07/25/2008 FINDINGS: Sternotomy wires overlie obscured cardiac silhouette. Low lung volumes with elevation of the RIGHT hemidiaphragm. There is perihilar airspace disease which obscures the cardiac shadow. No pneumothorax. Severe degenerate changes of the shoulders. Gaseous distention of the colon IMPRESSION: Bibasilar airspace disease in a pattern typical of viral pneumonia (COVID pneumonia). Low lung volumes. Electronically Signed   By: Genevive Bi M.D.   On: 01/18/2019 13:45   ECHOCARDIOGRAM COMPLETE  Result Date: 01/02/2019   ECHOCARDIOGRAM REPORT   Patient Name:   HARRY SHUCK Date of Exam: 01/02/2019 Medical Rec #:  [id number]      Height:       68.0 in Accession #:    [id number]     Weight:       172.0 lb Date of Birth:  08/06/1928      BSA:          1.92 m Patient Age:    89 years       BP:           93/55 mmHg Patient Gender: M              HR:           64 bpm. Exam Location:  Inpatient Procedure: 2D Echo, Cardiac Doppler and Color  Doppler Indications:    I48.91* Unspeicified atrial fibrillation  History:        Patient has prior history of Echocardiogram examinations, most                  recent 09/17/2010. CAD, Abnormal ECG; Arrythmias:Atrial                 Fibrillation. Covid positive. Pneumonia. LV dysfunction. ETOH.  Sonographer:    Roseanna Rainbow RDCS Referring Phys: [id number] ARAVIND CHANDRA  Sonographer Comments: Technically difficult study due to poor echo windows. In chair. Study was extremely difficult because patient was in chair. IMPRESSIONS  1. Left ventricular ejection fraction, by visual estimation, is 40 to 45%. The left ventricle has mildly decreased function. There is no left ventricular hypertrophy.  2. The left ventricle demonstrates global hypokinesis.  3. Global right ventricle has mildly reduced systolic function.The right ventricular size is normal. No increase in right ventricular wall thickness.  4. Left atrial size was normal.  5. Right atrial size was normal.  6. Presence of pericardial fat pad.  7. The mitral valve is degenerative. Mild mitral valve regurgitation.  8. The tricuspid valve is grossly normal. Tricuspid valve regurgitation is trivial.  9. The aortic valve is tricuspid. Aortic valve regurgitation is not visualized. Mild to moderate aortic valve sclerosis/calcification without any evidence of aortic stenosis. 10. The pulmonic valve was grossly normal. Pulmonic valve regurgitation is not visualized. 11. TR signal is inadequate for assessing pulmonary artery systolic pressure. 12. No prior Echocardiogram. FINDINGS  Left Ventricle: Left ventricular ejection fraction, by visual estimation, is 40 to 45%. The left ventricle has mildly decreased function. The left ventricle demonstrates global hypokinesis. The left ventricular internal cavity size was the left ventricle is normal in size. There is no left ventricular hypertrophy. The left ventricular diastology could not be evaluated due to nondiagnostic images. Right Ventricle: The right ventricular size is normal. No increase in right ventricular wall thickness. Global RV systolic function is has mildly reduced  systolic function. Left Atrium: Left atrial size was normal in size. Right Atrium: Right atrial size was normal in size Pericardium: There is no evidence of pericardial effusion. Presence of pericardial fat pad. Mitral Valve: The mitral valve is degenerative in appearance. Mild mitral valve regurgitation. Tricuspid Valve: The tricuspid valve is grossly normal. Tricuspid valve regurgitation is trivial. Aortic Valve: The aortic valve is tricuspid. Aortic valve regurgitation is not visualized. Mild to moderate aortic valve sclerosis/calcification is present, without any evidence of aortic stenosis. Pulmonic Valve: The pulmonic valve was grossly normal. Pulmonic valve regurgitation is not visualized. Pulmonic regurgitation is not visualized. Aorta: The aortic root is normal in size and structure. Venous: The inferior vena cava was not well visualized. IAS/Shunts: No atrial level shunt detected by color flow Doppler.  LEFT VENTRICLE PLAX 2D LVIDd:         4.84 cm LVIDs:         4.23 cm LV PW:         1.15 cm LV IVS:        1.02 cm LVOT diam:     2.40 cm LV SV:         30 ml LV SV Index:   15.26 LVOT Area:     4.52 cm  LV Volumes (MOD) LV area d, A2C:    16.40 cm LV area d, A4C:    24.30 cm LV area s, A2C:    12.50 cm LV area s, A4C:  19.30 cm LV major d, A2C:   4.91 cm LV major d, A4C:   6.33 cm LV major s, A2C:   4.83 cm LV major s, A4C:   5.61 cm LV vol d, MOD A2C: 45.5 ml LV vol d, MOD A4C: 76.3 ml LV vol s, MOD A2C: 29.7 ml LV vol s, MOD A4C: 57.4 ml LV SV MOD A2C:     15.8 ml LV SV MOD A4C:     76.3 ml LV SV MOD BP:      22.7 ml RIGHT VENTRICLE RV S prime:     11.50 cm/s TAPSE (M-mode): 1.2 cm LEFT ATRIUM             Index       RIGHT ATRIUM           Index LA diam:        4.20 cm 2.19 cm/m  RA Area:     14.20 cm LA Vol (A2C):   45.4 ml 23.68 ml/m RA Volume:   27.90 ml  14.56 ml/m LA Vol (A4C):   57.6 ml 30.05 ml/m LA Biplane Vol: 54.1 ml 28.22 ml/m  AORTIC VALVE LVOT Vmax:   83.50 cm/s LVOT Vmean:   56.800 cm/s LVOT VTI:    0.159 m  AORTA Ao Root diam: 3.20 cm Ao Asc diam:  3.30 cm MITRAL VALVE MV Area (PHT): 2.83 cm             SHUNTS MV PHT:        77.72 msec           Systemic VTI:  0.16 m MV Decel Time: 268 msec             Systemic Diam: 2.40 cm MV E velocity: 52.90 cm/s 103 cm/s MV A velocity: 67.90 cm/s 70.3 cm/s MV E/A ratio:  0.78       1.5  Lennie Odor MD Electronically signed by Lennie Odor MD Signature Date/Time: 01/02/2019/4:49:17 PM    Final         Scheduled Meds: . albuterol  2 puff Inhalation Q6H  . aspirin EC  81 mg Oral Daily  . atorvastatin  20 mg Oral Daily  . dexamethasone (DECADRON) injection  6 mg Intravenous Q24H  . enoxaparin (LOVENOX) injection  40 mg Subcutaneous Q24H  . umeclidinium bromide  1 puff Inhalation Daily   And  . fluticasone furoate-vilanterol  1 puff Inhalation Daily  . metoprolol tartrate  25 mg Oral BID  . multivitamin with minerals  1 tablet Oral Daily  . omega-3 acid ethyl esters  1 g Oral BID  . vitamin C  500 mg Oral Daily  . zinc sulfate  220 mg Oral Daily   Continuous Infusions: . cefTRIAXone (ROCEPHIN)  IV Stopped (01/03/19 6644)  . remdesivir 100 mg in NS 100 mL 100 mg (01/03/19 1007)     LOS: 2 days    Time spent: 25 minutes    Dorcas Carrow, MD [facility] Pager [phone]

## 2019-01-04 ENCOUNTER — Inpatient Hospital Stay (HOSPITAL_COMMUNITY): Payer: Medicare Other

## 2019-01-04 DIAGNOSIS — I6330 Cerebral infarction due to thrombosis of unspecified cerebral artery: Secondary | ICD-10-CM

## 2019-01-04 DIAGNOSIS — J1289 Other viral pneumonia: Secondary | ICD-10-CM

## 2019-01-04 DIAGNOSIS — J9611 Chronic respiratory failure with hypoxia: Secondary | ICD-10-CM

## 2019-01-04 DIAGNOSIS — U071 COVID-19: Principal | ICD-10-CM

## 2019-01-04 MED ORDER — STERILE WATER FOR INJECTION IJ SOLN
INTRAMUSCULAR | Status: AC
  Administered 2019-01-04: 2.1 mL
  Filled 2019-01-04: qty 10

## 2019-01-04 MED ORDER — HALOPERIDOL LACTATE 5 MG/ML IJ SOLN
0.5000 mg | Freq: Four times a day (QID) | INTRAMUSCULAR | Status: DC | PRN
  Administered 2019-01-04 – 2019-01-05 (×3): 0.5 mg via INTRAVENOUS
  Filled 2019-01-04 (×4): qty 1

## 2019-01-04 MED ORDER — OLANZAPINE 10 MG IM SOLR
5.0000 mg | Freq: Once | INTRAMUSCULAR | Status: AC
  Administered 2019-01-04: 5 mg via INTRAMUSCULAR
  Filled 2019-01-04: qty 10

## 2019-01-04 MED ORDER — ACETAMINOPHEN 10 MG/ML IV SOLN
1000.0000 mg | Freq: Once | INTRAVENOUS | Status: AC
  Administered 2019-01-04: 1000 mg via INTRAVENOUS
  Filled 2019-01-04: qty 100

## 2019-01-04 NOTE — Consult Note (Signed)
NAME:  Jermaine Wright, MRN:  [id number], DOB:  11/14/1928, LOS: 3 ADMISSION DATE:  01/18/2019, CONSULTATION DATE:  01/03/2019 REFERRING MD: Dr. Sloan Leiter, CHIEF COMPLAINT: Covid pneumonia, stroke  Brief History   85 year old gentleman who was admitted with complaints of weakness shortness of breath History significant for COPD, CABG in the past, coronary artery disease, right hemidiaphragm paralysis, osteoarthritis Weakness for a few days Tested positive for Covid Admitted for Covid pneumonia Noted to have gaze preference and transferred to neuro ICU  History of present illness   Functional at baseline, ambulates with a cane Profound weakness led to EMS being activated  Past Medical History   Past Medical History:  Diagnosis Date  . AAA (abdominal aortic aneurysm) ([place])   . Arthritis   . Asthma   . BPH with elevated PSA   . CAD (coronary artery disease)    Prior MI with CABG in 2010  . COPD (chronic obstructive pulmonary disease) ([place])   . H/O: asbestos exposure   . Hemidiaphragm paralysis    RIGHT HEMIDIAPHRAGM  . Herpes 02/2008  . LV dysfunction    EF 55% per echo in 8/12  . OA (osteoarthritis)   . SOB (shortness of breath)      Significant Hospital Events   Gaze preference noted 01/03/2019 Code stroke activated 01/03/2019 Received TPA 01/03/2019  Consults:  Neurology  Procedures:    Significant Diagnostic Tests:  CT head IMPRESSION: 1. Extensive calcified plaque at both carotid bifurcations. 80% or greater stenosis of the proximal ICA on the right. 2. 20% stenosis of the proximal ICA on the left. 3. Calcified plaque at both vertebral artery origins but without flow limiting stenosis. 4. No intracranial large or medium vessel occlusion or correctable proximal stenosis. 5. Pulmonary infiltrates right worse than left consistent with viral pneumonia.  Micro Data:  SARS coronavirus positive  Antimicrobials:  Remdesivir  Interim history/subjective:    Admitted to [place] Transferred to 4 N. 01/03/2019, received TPA about 10 PM  Objective   Blood pressure 120/90, pulse 95, temperature (!) 97.1 F (36.2 C), temperature source Axillary, resp. rate (!) 24, height 5\' 8"  (1.727 m), weight 65.4 kg, SpO2 (!) 88 %.        Intake/Output Summary (Last 24 hours) at 01/04/2019 0943 Last data filed at 01/04/2019 0700 Gross per 24 hour  Intake 1305.86 ml  Output 500 ml  Net 805.86 ml   Filed Weights   01/11/2019 1412 01/04/19 0600  Weight: 78 kg 65.4 kg    Examination: General: Elderly, chronically ill-appearing HENT: Dry oral mucosa Lungs: Rhonchi bilaterally, poor air movement Cardiovascular: S1-S2 appreciated Abdomen: Soft, bowel sounds appreciated Extremities: No clubbing, no edema Neuro: Awake, does not follow commands, profound weakness on left side GU:   Resolved Hospital Problem list     Assessment & Plan:  Covid pneumonia  -Continue remdesivir  -Continue Decadron -Empiric Rocephin for possible pneumonia  Acute hypoxemic respiratory failure -Continue oxygen supplementation -Currently requiring 100% FiO2 -He does have background COPD, elevated hemidiaphragm  Acute stroke -S/p TPA -Still does have dense hemiplegia -Still with gaze preference -Neurology following -CT will be repeated this evening -Discussed with Dr. Leonie Man -His multiple comorbidities and his new stroke portends a poor prognosis  Contacted patient's daughter and spoke with on the phone -Patient was made DO NOT RESUSCITATE -We will continue current lines of care -She is aware that he may decompensate, patient will not want to be on the ventilator or want any aggressive measures to keep  him alive  Coronary artery disease -Stable at present  History left ventricular dysfunction -Echocardiogram reveals 40 to 45% ejection fraction  Chronic obstructive pulmonary disease -Continue albuterol as needed -Incruse Ellipta  Best practice:  Diet:  N.p.o. Pain/Anxiety/Delirium protocol (if indicated): Did receive Haldol for agitation VAP protocol (if indicated): Not indicated DVT prophylaxis: SCD GI prophylaxis: Protonix Glucose control: Not diabetic Mobility: Bedrest Code Status: DNR Family Communication: Updated daughter by phone this morning Disposition: ICU Labs   CBC: Recent Labs  Lab 01/20/2019 1148 01/02/19 0545 01/03/19 0405  WBC 5.6 6.4 7.6  NEUTROABS 3.7 5.0 6.2  HGB 15.3 14.7 14.9  HCT 48.2 46.6 46.9  MCV 89.8 90.3 89.0  PLT 124* 121* 167    Basic Metabolic Panel: Recent Labs  Lab 12/30/2018 1148 01/02/19 0545 01/03/19 0405  NA 139 143  --   K 3.9 4.4  --   CL 105 107  --   CO2 23 26  --   GLUCOSE 94 127*  --   BUN 29* 27*  --   CREATININE 1.06 0.89  --   CALCIUM 8.2* 8.4*  --   MG  --  2.4 2.2  PHOS  --  3.4 2.3*   GFR: Estimated Creatinine Clearance: 53.1 mL/min (by C-G formula based on SCr of 0.89 mg/dL). Recent Labs  Lab 01/19/2019 1148 12/31/2018 1309 01/02/19 0545 01/03/19 0405  PROCALCITON  --  <0.10 <0.10 <0.10  WBC 5.6  --  6.4 7.6  LATICACIDVEN 1.7  --   --   --     Liver Function Tests: Recent Labs  Lab 01/08/2019 1148 01/02/19 0545  AST 58* 51*  ALT 21 20  ALKPHOS 60 57  BILITOT 1.2 1.1  PROT 6.9 6.3*  ALBUMIN 3.6 3.3*   No results for input(s): LIPASE, AMYLASE in the last 168 hours. No results for input(s): AMMONIA in the last 168 hours.  ABG    Component Value Date/Time   PHART 7.361 06/25/2008 2007   PCO2ART 40.2 06/25/2008 2007   PO2ART 112.0 (H) 06/25/2008 2007   HCO3 22.8 06/25/2008 2007   TCO2 27 06/26/2008 1702   ACIDBASEDEF 2.0 06/25/2008 2007   O2SAT 98.0 06/25/2008 2007     Coagulation Profile: No results for input(s): INR, PROTIME in the last 168 hours.  Cardiac Enzymes: No results for input(s): CKTOTAL, CKMB, CKMBINDEX, TROPONINI in the last 168 hours.  HbA1C: Hgb A1c MFr Bld  Date/Time Value Ref Range Status  06/23/2008 04:45 AM  4.6 - 6.1 %  Final   5.9 (NOTE) The ADA recommends the following therapeutic goal for glycemic control related to Hgb A1c measurement: Goal of therapy: <6.5 Hgb A1c  Reference: American Diabetes Association: Clinical Practice Recommendations 2010, Diabetes Care, 2010, 33: (Suppl  1).    CBG: No results for input(s): GLUCAP in the last 168 hours.  Review of Systems:   Unobtainable  Past Medical History  He,  has a past medical history of AAA (abdominal aortic aneurysm) ([facility]), Arthritis, Asthma, BPH with elevated PSA, CAD (coronary artery disease), COPD (chronic obstructive pulmonary disease) ([facility]), H/O: asbestos exposure, Hemidiaphragm paralysis, Herpes (02/2008), LV dysfunction, OA (osteoarthritis), and SOB (shortness of breath).   Surgical History    Past Surgical History:  Procedure Laterality Date  . CARDIAC CATHETERIZATION  06/17/2008   THERE WAS INCOMPLETE FILLING OF THE VENTRICLE, EF 40% WITH VERY MILD ANTERIOR HYPOKINESIS  . CORONARY ARTERY BYPASS GRAFT  06/2008   FOR CLASS IV UNSTABLE ANGINA WITH SUBENDOCARDIAL INFARCTION  .  HERNIA REPAIR  1954  . HYDROCELE EXCISION  07/11/2011   Procedure: HYDROCELECTOMY ADULT;  Surgeon: Marcine MatarStephen Dahlstedt, MD;  Location: [facility];  Service: Urology;  Laterality: Left;     . US ECHOCARDIOGRAPHY  09/03/2008   EF 40-45%     Social History   reports that he quit smoking about 33 years ago. His smoking use included cigarettes. He has a 44.00 pack-year smoking history. He has never used smokeless tobacco. He reports that he does not drink alcohol or use drugs.   Family History   His family history includes Aneurysm in his mother; Heart attack in his mother.   Allergies Allergies  Allergen Reactions  . Chlorhexidine Gluconate [Chlorhexidine] Rash    The patient is critically ill with multiple organ systems failure and requires high complexity decision making for assessment and support, frequent evaluation and titration of therapies, application of advanced  monitoring technologies and extensive interpretation of multiple databases. Critical Care Time devoted to patient care services described in this note independent of APP/resident time (if applicable)  is 35 minutes.   Virl DiamondAdewale Lakayla Barrington MD [facility] Personal pager: [phone] If unanswered, please page CCM On-call: [phone]

## 2019-01-04 NOTE — Progress Notes (Signed)
STROKE TEAM PROGRESS NOTE   HISTORY OF PRESENT ILLNESS (per record) Jermaine Wright is an 85 y.o. male who [facility] after presenting with generalized weakness. He is now exhibiting stroke-like symptoms of left hemineglect, left sided weakness, rightward gaze deviation. LKN was 3:20 PM. Neurology was called for STAT evaluation of stroke versus ICH. Currently being treated for Covid PNA in an ICU setting. He is on ASA but not on an anticoagulant. He has no history of intracranial hemorrhage and no recent bleeding, MI, head trauma or major surgery. BP is not severely elevated. His mRS is 0 at home based on discussion over the telephone with his daughter.  Stroke risk factors include Covid infection and CAD.   LSN: 3:20 PM tPA Given: Yes  INTERVAL HISTORY I personally reviewed history of presenting illness, electronic medical records and imaging films in PACS. He remains in respiratory distress and hypoxic and is presently on 100% nonrebreather and oxygen saturations are yet only in the high 80s.  He is unresponsive with right gaze deviation left hemiplegia and left hemineglect.  His blood pressure has been adequately controlled following his TPA administration. He is Covid positive and chest x-ray shows low lung volumes and infiltrates suggestive of viral pneumonia  OBJECTIVE Vitals:   01/04/19 0300 01/04/19 0330 01/04/19 0400 01/04/19 0500  BP: 113/90 122/86 123/79 124/79  Pulse: 90 99 (!) 102 (!) 103  Resp: (!) 27 18 (!) 28 (!) 27  Temp:   (!) 97.1 F (36.2 C)   TempSrc:   Axillary   SpO2: (!) 85% 90% (!) 89% (!) 87%  Weight:      Height:        CBC:  Recent Labs  Lab 01/02/19 0545 01/03/19 0405  WBC 6.4 7.6  NEUTROABS 5.0 6.2  HGB 14.7 14.9  HCT 46.6 46.9  MCV 90.3 89.0  PLT 121* 167    Basic Metabolic Panel:  Recent Labs  Lab 12/31/2018 1148 01/02/19 0545 01/03/19 0405  NA 139 143  --   K 3.9 4.4  --   CL 105 107  --   CO2 23 26  --    GLUCOSE 94 127*  --   BUN 29* 27*  --   CREATININE 1.06 0.89  --   CALCIUM 8.2* 8.4*  --   MG  --  2.4 2.2  PHOS  --  3.4 2.3*    Lipid Panel:     Component Value Date/Time   CHOL 125 09/07/2010 1033   TRIG 81 01/04/2019 1309   HDL 47.00 09/07/2010 1033   CHOLHDL 3 09/07/2010 1033   VLDL 14.4 09/07/2010 1033   LDLCALC 64 09/07/2010 1033   HgbA1c:  Lab Results  Component Value Date   HGBA1C  06/23/2008    5.9 (NOTE) The ADA recommends the following therapeutic goal for glycemic control related to Hgb A1c measurement: Goal of therapy: <6.5 Hgb A1c  Reference: American Diabetes Association: Clinical Practice Recommendations 2010, Diabetes Care, 2010, 33: (Suppl  1).   Urine Drug Screen: No results found for: LABOPIA, COCAINSCRNUR, LABBENZ, AMPHETMU, THCU, LABBARB  Alcohol Level No results found for: [facility]  IMAGING   CT Code Stroke CTA Head W/WO contrast 01/03/2019 IMPRESSION:  1. Extensive calcified plaque at both carotid bifurcations. 80% or greater stenosis of the proximal ICA on the right.  2. 20% stenosis of the proximal ICA on the left.  3. Calcified plaque at both vertebral artery origins but  without flow limiting stenosis.  4. No intracranial large or medium vessel occlusion or correctable proximal stenosis.  5. Pulmonary infiltrates right worse than left consistent with viral pneumonia.   CT HEAD WO CONTRAST 01/03/2019 IMPRESSION:  1. Nondiagnostic study due to extensive motion.  2. Atrophy and chronic microvascular ischemia.   ECHOCARDIOGRAM COMPLETE 01/02/2019 IMPRESSIONS   1. Left ventricular ejection fraction, by visual estimation, is 40 to 45%. The left ventricle has mildly decreased function. There is no left ventricular hypertrophy.   2. The left ventricle demonstrates global hypokinesis.   3. Global right ventricle has mildly reduced systolic function.The right ventricular size is normal. No increase in right ventricular wall thickness.   4. Left  atrial size was normal.   5. Right atrial size was normal.   6. Presence of pericardial fat pad.   7. The mitral valve is degenerative. Mild mitral valve regurgitation.   8. The tricuspid valve is grossly normal. Tricuspid valve regurgitation is trivial.   9. The aortic valve is tricuspid. Aortic valve regurgitation is not visualized. Mild to moderate aortic valve sclerosis/calcification without any evidence of aortic stenosis.  10. The pulmonic valve was grossly normal. Pulmonic valve regurgitation is not visualized.  11. TR signal is inadequate for assessing pulmonary artery systolic pressure.  12. No prior Echocardiogram.    ECG - atrial fibrillation - ventricular response 80 BPM (See cardiology reading for complete details)   PHYSICAL EXAM Blood pressure 124/79, pulse (!) 103, temperature (!) 97.1 F (36.2 C), temperature source Axillary, resp. rate (!) 27, height 5\' 8"  (1.727 m), weight 78 kg, SpO2 (!) 87 %. Frail cachectic malnourished looking elderly Caucasian male in significant respiratory distress on 100% nonrebreather. . Afebrile. Head is nontraumatic. Neck is supple without bruit.    Cardiac exam no murmur or gallop. Lungs are clear to auscultation. Distal pulses are well felt. Neurological Exam : Patient is stuporous and unresponsive.  He barely opens eyes to stimulation.  Patient does not follow any commands or speak any words he has right gaze preference and will not look to the left.  Pupils are irregular reactive.  Doll's eye movements are sluggish.  Left facial weakness.  Tongue midline.  Patient has spontaneous right upper and lower extremity movements but has left hemiplegia with only trace withdrawal to noxious stimuli in the leg more than the.  Tone is diminished on the left compared to the right.  Left plantars upgoing right is downgoing.   Jermaine Wright is a 85 y.o. male with history of CAD and Covid pneumonia presenting with acute onset of left hemineglect, left  sided weakness, rightward gaze deviation. He received IV t-PA Thursday 01/03/19 @ 1830  Stroke: Right MCA infarct s/p IV TPA  Resultant right gaze deviation left hemiplegia and left hemineglect  Code Stroke CT Head - not ordered  CT head - Nondiagnostic study due to extensive motion. Atrophy and chronic microvascular ischemia.   MRI head - not ordered  MRA head - not ordered  CTA H&N - Extensive calcified plaque at both carotid bifurcations. 80% or greater stenosis of the proximal ICA on the right.   CT Perfusion - not ordered  Carotid Doppler - CTA neck performed - carotid dopplers not indicated.  2D Echo - EF 40 - 45%. No cardiac source of emboli identified.   Sars Corona Virus 2 - positive  LDL - will order - pending  HgbA1c - will order - pending  UDS - not ordered  VTE  prophylaxis - SCDs Diet  Diet Order            Diet NPO time specified  Diet effective now               aspirin 81 mg daily prior to admission, now on No antithrombotic s/p t-PA  Patient counseled to be compliant with his antithrombotic medications  Ongoing aggressive stroke risk factor management  Therapy recommendations:  pending  Disposition:  Pending  Hypertension  Home BP meds: metoprolol  Current BP meds: metoprolol  Stable . Permissive hypertension (OK if < 220/120) but gradually normalize in 5-7 days   . Long-term BP goal normotensive  Hyperlipidemia  Home Lipid lowering medication: Lipitor 20 mg daily  LDL - pending, goal < 70  Current lipid lowering medication: Lipitor 20 mg daily  Continue statin at discharge  Covid Positive  C-reactive protein - 7.2  D-dimer - 2.29  Ferritin - 1,368  LDH - 381  Magnesium - 2.2  Phosphorus - 2.3  Decadron  Remdesivir  Temp - 97.1  WBCs - 7.6  Possible Atrial Fibrillation by ECG  New diagnosis  Consider anticoagulation  Other Stroke Risk Factors  Advanced age  Former cigarette smoker -  quit  Coronary artery disease  Covid positive  Atrial fibrillation  Other Active Problems  COPD - asbestos exposure  Covid pneumonia - respiratory failure  Chronic Cardiomyopathy EF 40 -45% - likely due to CAD (prior MI and CABG)  80% or greater stenosis of the proximal ICA on the right.   Atrial fibrillation  Full Code      Hospital day # 3  Patient presented with Covid infection and in hospital developed sudden onset of right gaze deviation left hemiplegia and neglect and was treated with IV TPA without significant improvement in his neurological condition with worsening of his respiratory situation.  Patient prognosis remains quite poor due to his underlying Covid status.  I had a long discussion with the patient's daughter over the phone about his poor prognosis and lack of improvement after TPA.  The daughter is realistic and understand that that would not like prolonged ventilatory support and feeding tube and nursing home care which may be unavoidable.  Patient is DNR.  Discussed with critical care attending Dr. Wende Crease.  We will continue to support the patient but if condition declines willing towards palliative care.  Continue to maintain strict blood pressure control and close neurological monitoring as per post TPA protocol.  Repeat CT scan of the head this evening and if no bleed start antiplatelet agent aspirin unless critical care feels patient needs anticoagulation from Covid related hypercoagulable state.This patient is critically ill and at significant risk of neurological worsening, death and care requires constant monitoring of vital signs, hemodynamics,respiratory and cardiac monitoring, extensive review of multiple databases, frequent neurological assessment, discussion with family, other specialists and medical decision making of high complexity.I have made any additions or clarifications directly to the above note.This critical care time does not reflect procedure  time, or teaching time or supervisory time of PA/NP/Med Resident etc but could involve care discussion time.  I spent 30 minutes of neurocritical care time  in the care of  this patient.   Antony Contras, MD To contact Stroke Continuity provider, please refer to [email or web address] After hours, contact General Neurology

## 2019-01-04 NOTE — Progress Notes (Signed)
[facility] Progress Note Patient Name: Jermaine Wright DOB: Mar 20, 1928 MRN: [id number]   Date of Service  01/04/2019  HPI/Events of Note  Pt with agitated delirium, due to severe aphasia it is not possible to entirely exclude pain as contributing factor. QT 467 ms on last EKG. LFT's unremarkable.  eICU Interventions  Haldol 0.5 mg iv Q 6 hours PRN delirium, Ofirmev 1000 mg iv x 1        Lorina Duffner U Shakeem Stern 01/04/2019, 2:46 AM

## 2019-01-04 NOTE — Progress Notes (Addendum)
CHG bath protocol not implemented d/t allergy  Items transferred with patient from [facility] include hospital-provided inhalers, plaid pajama pants, and green long sleeve shirt. Belongings placed in closet in patient room.

## 2019-01-05 DIAGNOSIS — J441 Chronic obstructive pulmonary disease with (acute) exacerbation: Secondary | ICD-10-CM

## 2019-01-05 DIAGNOSIS — I2510 Atherosclerotic heart disease of native coronary artery without angina pectoris: Secondary | ICD-10-CM

## 2019-01-05 LAB — CBC WITH DIFFERENTIAL/PLATELET
Abs Immature Granulocytes: 0.24 10*3/uL — ABNORMAL HIGH (ref 0.00–0.07)
Basophils Absolute: 0.1 10*3/uL (ref 0.0–0.1)
Basophils Relative: 0 %
Eosinophils Absolute: 0 10*3/uL (ref 0.0–0.5)
Eosinophils Relative: 0 %
HCT: 44.6 % (ref 39.0–52.0)
Hemoglobin: 14.6 g/dL (ref 13.0–17.0)
Immature Granulocytes: 2 %
Lymphocytes Relative: 8 %
Lymphs Abs: 1.4 10*3/uL (ref 0.7–4.0)
MCH: 28.6 pg (ref 26.0–34.0)
MCHC: 32.7 g/dL (ref 30.0–36.0)
MCV: 87.3 fL (ref 80.0–100.0)
Monocytes Absolute: 1.3 10*3/uL — ABNORMAL HIGH (ref 0.1–1.0)
Monocytes Relative: 8 %
Neutro Abs: 13.5 10*3/uL — ABNORMAL HIGH (ref 1.7–7.7)
Neutrophils Relative %: 82 %
Platelets: 65 10*3/uL — ABNORMAL LOW (ref 150–400)
RBC: 5.11 MIL/uL (ref 4.22–5.81)
RDW: 14.8 % (ref 11.5–15.5)
WBC: 16.5 10*3/uL — ABNORMAL HIGH (ref 4.0–10.5)
nRBC: 0 % (ref 0.0–0.2)

## 2019-01-05 LAB — COMPREHENSIVE METABOLIC PANEL
ALT: 47 U/L — ABNORMAL HIGH (ref 0–44)
AST: 115 U/L — ABNORMAL HIGH (ref 15–41)
Albumin: 3.5 g/dL (ref 3.5–5.0)
Alkaline Phosphatase: 122 U/L (ref 38–126)
Anion gap: 16 — ABNORMAL HIGH (ref 5–15)
BUN: 68 mg/dL — ABNORMAL HIGH (ref 8–23)
CO2: 24 mmol/L (ref 22–32)
Calcium: 9.2 mg/dL (ref 8.9–10.3)
Chloride: 111 mmol/L (ref 98–111)
Creatinine, Ser: 3.47 mg/dL — ABNORMAL HIGH (ref 0.61–1.24)
GFR calc Af Amer: 17 mL/min — ABNORMAL LOW (ref >60)
GFR calc non Af Amer: 15 mL/min — ABNORMAL LOW (ref >60)
Glucose, Bld: 159 mg/dL — ABNORMAL HIGH (ref 70–99)
Potassium: 4.9 mmol/L (ref 3.5–5.1)
Sodium: 151 mmol/L — ABNORMAL HIGH (ref 135–145)
Total Bilirubin: 1.8 mg/dL — ABNORMAL HIGH (ref 0.3–1.2)
Total Protein: 6.2 g/dL — ABNORMAL LOW (ref 6.5–8.1)

## 2019-01-05 LAB — PHOSPHORUS: Phosphorus: 3.2 mg/dL (ref 2.5–4.6)

## 2019-01-05 LAB — APTT: aPTT: 158 seconds — ABNORMAL HIGH (ref 24–36)

## 2019-01-05 LAB — HEMOGLOBIN A1C
Hgb A1c MFr Bld: 6.1 % — ABNORMAL HIGH (ref 4.8–5.6)
Mean Plasma Glucose: 128.37 mg/dL

## 2019-01-05 LAB — C-REACTIVE PROTEIN: CRP: 6.4 mg/dL — ABNORMAL HIGH (ref <1.0)

## 2019-01-05 LAB — PROTIME-INR
INR: 1.9 — ABNORMAL HIGH (ref 0.8–1.2)
Prothrombin Time: 22 seconds — ABNORMAL HIGH (ref 11.4–15.2)

## 2019-01-05 LAB — FERRITIN: Ferritin: 1428 ng/mL — ABNORMAL HIGH (ref 24–336)

## 2019-01-05 LAB — HEPARIN LEVEL (UNFRACTIONATED): Heparin Unfractionated: 0.57 IU/mL (ref 0.30–0.70)

## 2019-01-05 LAB — LIPID PANEL
Cholesterol: 105 mg/dL (ref 0–200)
HDL: 29 mg/dL — ABNORMAL LOW (ref >40)
LDL Cholesterol: 56 mg/dL (ref 0–99)
Total CHOL/HDL Ratio: 3.6 RATIO
Triglycerides: 101 mg/dL (ref <150)
VLDL: 20 mg/dL (ref 0–40)

## 2019-01-05 LAB — D-DIMER, QUANTITATIVE: D-Dimer, Quant: >20 ug/mL-FEU — ABNORMAL HIGH (ref 0.00–0.50)

## 2019-01-05 LAB — MAGNESIUM: Magnesium: 2.8 mg/dL — ABNORMAL HIGH (ref 1.7–2.4)

## 2019-01-05 MED ORDER — DEXTROSE-NACL 5-0.2 % IV SOLN
INTRAVENOUS | Status: DC
  Administered 2019-01-05 – 2019-01-06 (×3): via INTRAVENOUS
  Filled 2019-01-05 (×4): qty 1000

## 2019-01-05 MED ORDER — HEPARIN (PORCINE) 25000 UT/250ML-% IV SOLN
850.0000 [IU]/h | INTRAVENOUS | Status: DC
  Administered 2019-01-05: 1050 [IU]/h via INTRAVENOUS
  Filled 2019-01-05 (×2): qty 250

## 2019-01-05 MED ORDER — ASPIRIN EC 81 MG PO TBEC: 81.0000 mg | DELAYED_RELEASE_TABLET | Freq: Every day | ORAL | Status: DC

## 2019-01-05 MED ORDER — LACTATED RINGERS IV BOLUS
500.0000 mL | Freq: Once | INTRAVENOUS | Status: AC
  Administered 2019-01-05: 500 mL via INTRAVENOUS

## 2019-01-05 MED ORDER — ALBUMIN HUMAN 5 % IV SOLN
12.5000 g | Freq: Once | INTRAVENOUS | Status: AC
  Administered 2019-01-05: 12.5 g via INTRAVENOUS
  Filled 2019-01-05: qty 500

## 2019-01-05 NOTE — Progress Notes (Addendum)
NAME:  Jermaine Wright, MRN:  [id number], DOB:  [birth year], LOS: 4 ADMISSION DATE:  01/05/2019, CONSULTATION DATE: 01/03/2019 REFERRING MD: Dr. Sloan Leiter, CHIEF COMPLAINT: Covid pneumonia, stroke  Brief History   85 year old gentleman who was admitted with complaints of weakness shortness of breath History significant for COPD, CABG in the past, coronary artery disease, right hemidiaphragm paralysis, osteoarthritis Weakness for a few days Tested positive for Covid Admitted for Covid pneumonia Noted to have gaze preference and transferred to neuro ICU  History of present illness   Functional at baseline, ambulates with a cane Profound weakness led to EMS being activated   Past Medical History   Past Medical History:  Diagnosis Date  . AAA (abdominal aortic aneurysm) ([place])   . Arthritis   . Asthma   . BPH with elevated PSA   . CAD (coronary artery disease)    Prior MI with CABG in 2010  . COPD (chronic obstructive pulmonary disease) ([place])   . H/O: asbestos exposure   . Hemidiaphragm paralysis    RIGHT HEMIDIAPHRAGM  . Herpes 02/2008  . LV dysfunction    EF 55% per echo in 8/12  . OA (osteoarthritis)   . SOB (shortness of breath)      Significant Hospital Events   Gaze preference noted 01/03/2019 Code stroke activated 01/03/2019 Received TPA 01/03/2019  Consults:  Neurology  Procedures:  None  Significant Diagnostic Tests:  CT head IMPRESSION: 1. Extensive calcified plaque at both carotid bifurcations. 80% or greater stenosis of the proximal ICA on the right. 2. 20% stenosis of the proximal ICA on the left. 3. Calcified plaque at both vertebral artery origins but without flow limiting stenosis. 4. No intracranial large or medium vessel occlusion or correctable proximal stenosis. 5. Pulmonary infiltrates right worse than left consistent with viral pneumonia.  Repeat CT scan of the head 01/04/2019 IMPRESSION: No acute abnormality. Atrophy and chronic  microvascular ischemic change in the white matter  Image quality degraded by motion Micro Data:  SARS coronavirus positive Antimicrobials:  Remdesivir  Interim history/subjective:  Admitted to [facility] Transferred to 4 N. 01/03/2019 Received TPA 01/03/2019 Anuric overnight  Objective   Blood pressure 106/66, pulse (!) 103, temperature 97.8 F (36.6 C), temperature source Axillary, resp. rate (!) 25, height 5\' 8"  (1.727 m), weight 65.4 kg, SpO2 (!) 88 %.        Intake/Output Summary (Last 24 hours) at 01/05/2019 0944 Last data filed at 01/05/2019 0800 Gross per 24 hour  Intake 746.99 ml  Output 200 ml  Net 546.99 ml   Filed Weights   12/30/2018 1412 01/04/19 0600  Weight: 78 kg 65.4 kg    Examination: General: Elderly gentleman, chronically ill-appearing HENT: Dry oral mucosa Lungs: Rales bilaterally Cardiovascular: S1-S2 appreciated Abdomen: Soft, bowel sounds appreciated Extremities: No clubbing, no edema Neuro: Does not follow commands, moving right side, unable to move his left side GU: Very minimal output  Resolved Hospital Problem list     Assessment & Plan:  Covid pneumonia -Continue remdesivir  -Continue Decadron -Empiric Rocephin for possible pneumonia  Acute hypoxemic respiratory failure -Continue oxygen supplementation -Currently requiring under percent FiO2 -Background chronic obstructive pulmonary disease, elevated hemidiaphragm -Bronchodilators  Acute stroke -Status post TPA -Hemiplegic on the left -Neurology following -CT did not show any acute lesions -Patient too unstable for an MRI  Coronary artery disease -Stable at present -Continue metoprolol  Acute kidney injury -Likely prerenal -We will fluid resuscitate  History of left ventricular dysfunction -Echocardiogram reveals 40  to 45% ejection fraction  Chronic obstructive pulmonary disease -Continue albuterol as needed -Continue Incruse Ellipta -Breo  Leukocytosis -Likely  related to Decadron  I did call daughter to update her about Mrs. Blunck's condition -Did discuss his prognosis, at present it is poor -With new acute kidney injury, encephalopathy, respiratory failure -His likelihood of meaningful recovery appears to be worsening  Best practice:  Diet: N.p.o. Pain/Anxiety/Delirium protocol (if indicated): add Risperdal VAP protocol (if indicated): Not indicated DVT prophylaxis: SCD GI prophylaxis: Protonix Glucose control:  Mobility: Bedrest Code Status: DNR Family Communication: Updated daughter Disposition: icu  Labs   CBC: Recent Labs  Lab 01/21/2019 1148 01/02/19 0545 01/03/19 0405 01/05/19 0622  WBC 5.6 6.4 7.6 16.5*  NEUTROABS 3.7 5.0 6.2 13.5*  HGB 15.3 14.7 14.9 14.6  HCT 48.2 46.6 46.9 44.6  MCV 89.8 90.3 89.0 87.3  PLT 124* 121* 167 65*    Basic Metabolic Panel: Recent Labs  Lab 01/04/2019 1148 01/02/19 0545 01/03/19 0405 01/05/19 0622  NA 139 143  --  151*  K 3.9 4.4  --  4.9  CL 105 107  --  111  CO2 23 26  --  24  GLUCOSE 94 127*  --  159*  BUN 29* 27*  --  68*  CREATININE 1.06 0.89  --  3.47*  CALCIUM 8.2* 8.4*  --  9.2  MG  --  2.4 2.2 2.8*  PHOS  --  3.4 2.3* 3.2   GFR: Estimated Creatinine Clearance: 13.6 mL/min (A) (by C-G formula based on SCr of 3.47 mg/dL (H)). Recent Labs  Lab 01/02/2019 1148 01/23/2019 1309 01/02/19 0545 01/03/19 0405 01/05/19 0622  PROCALCITON  --  <0.10 <0.10 <0.10  --   WBC 5.6  --  6.4 7.6 16.5*  LATICACIDVEN 1.7  --   --   --   --     Liver Function Tests: Recent Labs  Lab 12/26/2018 1148 01/02/19 0545 01/05/19 0622  AST 58* 51* 115*  ALT 21 20 47*  ALKPHOS 60 57 122  BILITOT 1.2 1.1 1.8*  PROT 6.9 6.3* 6.2*  ALBUMIN 3.6 3.3* 3.5   No results for input(s): LIPASE, AMYLASE in the last 168 hours. No results for input(s): AMMONIA in the last 168 hours.  ABG    Component Value Date/Time   PHART 7.361 06/25/2008 2007   PCO2ART 40.2 06/25/2008 2007   PO2ART 112.0 (H)  06/25/2008 2007   HCO3 22.8 06/25/2008 2007   TCO2 27 06/26/2008 1702   ACIDBASEDEF 2.0 06/25/2008 2007   O2SAT 98.0 06/25/2008 2007     Coagulation Profile: No results for input(s): INR, PROTIME in the last 168 hours.  Cardiac Enzymes: No results for input(s): CKTOTAL, CKMB, CKMBINDEX, TROPONINI in the last 168 hours.  HbA1C: Hgb A1c MFr Bld  Date/Time Value Ref Range Status  01/05/2019 06:22 AM 6.1 (H) 4.8 - 5.6 % Final    Comment:    (NOTE) Pre diabetes:          5.7%-6.4% Diabetes:              >6.4% Glycemic control for   <7.0% adults with diabetes   06/23/2008 04:45 AM  4.6 - 6.1 % Final   5.9 (NOTE) The ADA recommends the following therapeutic goal for glycemic control related to Hgb A1c measurement: Goal of therapy: <6.5 Hgb A1c  Reference: American Diabetes Association: Clinical Practice Recommendations 2010, Diabetes Care, 2010, 33: (Suppl  1).    CBG: No results for input(s):  GLUCAP in the last 168 hours.  Review of Systems:   Unobtainable  Past Medical History  He,  has a past medical history of AAA (abdominal aortic aneurysm) ([facility]), Arthritis, Asthma, BPH with elevated PSA, CAD (coronary artery disease), COPD (chronic obstructive pulmonary disease) ([facility]), H/O: asbestos exposure, Hemidiaphragm paralysis, Herpes (02/2008), LV dysfunction, OA (osteoarthritis), and SOB (shortness of breath).   Surgical History    Past Surgical History:  Procedure Laterality Date  . CARDIAC CATHETERIZATION  06/17/2008   THERE WAS INCOMPLETE FILLING OF THE VENTRICLE, EF 40% WITH VERY MILD ANTERIOR HYPOKINESIS  . CORONARY ARTERY BYPASS GRAFT  06/2008   FOR CLASS IV UNSTABLE ANGINA WITH SUBENDOCARDIAL INFARCTION  . HERNIA REPAIR  1954  . HYDROCELE EXCISION  07/11/2011   Procedure: HYDROCELECTOMY ADULT;  Surgeon: Marcine MatarStephen Dahlstedt, MD;  Location: [facility];  Service: Urology;  Laterality: Left;     . US ECHOCARDIOGRAPHY  09/03/2008   EF 40-45%     Social History   reports that he  quit smoking about 33 years ago. His smoking use included cigarettes. He has a 44.00 pack-year smoking history. He has never used smokeless tobacco. He reports that he does not drink alcohol or use drugs.   Family History   His family history includes Aneurysm in his mother; Heart attack in his mother.   Allergies Allergies  Allergen Reactions  . Chlorhexidine Gluconate [Chlorhexidine] Rash     The patient is critically ill with multiple organ systems failure and requires high complexity decision making for assessment and support, frequent evaluation and titration of therapies, application of advanced monitoring technologies and extensive interpretation of multiple databases. Critical Care Time devoted to patient care services described in this note independent of APP/resident time (if applicable)  is 35 minutes.   Virl DiamondAdewale Jayren Cease MD [facility] Personal pager: [phone] If unanswered, please page CCM On-call: #[phone]  Addendum: Discussed with neurology Will start patient on full dose anticoagulation with heparin

## 2019-01-05 NOTE — Progress Notes (Signed)
[facility] Progress Note Patient Name: Jermaine Wright DOB: [birth year] MRN: [id number]   Date of Service  01/05/2019  HPI/Events of Note  Notified of oliguria, bladder empty on scan. Lactic acid 7,2 this morning.  eICU Interventions  Will give a trial of albumin 250 cc Patient somewhat restless but not in acute distress     Intervention Category Intermediate Interventions: Oliguria - evaluation and management  Shona Needles Emonii Wienke 01/05/2019, 2:18 AM

## 2019-01-05 NOTE — Progress Notes (Signed)
ANTICOAGULATION CONSULT NOTE  Pharmacy Consult:  Heparin Indication: r/o VTE  Allergies  Allergen Reactions  . Chlorhexidine Gluconate [Chlorhexidine] Rash    Patient Measurements: Height: 5\' 8"  (172.7 cm) Weight: 144 lb 2.9 oz (65.4 kg) IBW/kg (Calculated) : 68.4  Vital Signs: Temp: 98.1 F (36.7 C) (12/12 2000) Temp Source: Axillary (12/12 2000) BP: 117/84 (12/12 2000) Pulse Rate: 105 (12/12 2000)  Labs: Recent Labs    01/03/19 0405 01/05/19 0622  HGB 14.9 14.6  HCT 46.9 44.6  PLT 167 65*  CREATININE  --  3.47*    Estimated Creatinine Clearance: 13.6 mL/min (A) (by C-G formula based on SCr of 3.47 mg/dL (H)).   Assessment: 85 y/o male admitted with weakness and SOB who tested positive for COVID. Code stroke called on 12/10 and he received tPA on 12/10. CT was without acute lesion, unable to complete MRI. Pharmacy consulted to begin IV heparin for r/o VTE.   Heparin level is slightly supra-therapeutic.  Lab drawn appropriately and no bleeding per RN.  D-dimer now >20.  Goal of Therapy:  Heparin level 0.3-0.5 units/ml Monitor platelets by anticoagulation protocol: Yes   Plan:  Reduce heparin gtt to 950 units/hr F/U AM labs, comfort care plans  Sala Tague D. Mina Marble, PharmD, BCPS, [place] 01/05/2019, 10:24 PM

## 2019-01-05 NOTE — Progress Notes (Signed)
STROKE TEAM PROGRESS NOTE   INTERVAL HISTORY He remains in respiratory distress and hypoxic and is on nonrebreather. Able to open eyes to pain but not following commands or verbal. Still has left hemiplegia but able to slightly withdraw on the left foot with pain stimulation. Today D-dimmer is high > 20, and CT repeat no large infarct, discussed with Dr. Wynona Neatlalere and will start heparin drip.   OBJECTIVE Vitals:   01/05/19 0405 01/05/19 0500 01/05/19 0600 01/05/19 0700  BP: 114/86 104/68 108/74 113/70  Pulse: 98 100 (!) 104 (!) 27  Resp: (!) 28 (!) 21 (!) 24 (!) 26  Temp:      TempSrc:      SpO2: (!) 88% (!) 86% (!) 88% 91%  Weight:      Height:        CBC:  Recent Labs  Lab 01/03/19 0405 01/05/19 0622  WBC 7.6 16.5*  NEUTROABS 6.2 13.5*  HGB 14.9 14.6  HCT 46.9 44.6  MCV 89.0 87.3  PLT 167 65*    Basic Metabolic Panel:  Recent Labs  Lab 01/02/19 0545 01/03/19 0405 01/05/19 0622  NA 143  --  151*  K 4.4  --  4.9  CL 107  --  111  CO2 26  --  24  GLUCOSE 127*  --  159*  BUN 27*  --  68*  CREATININE 0.89  --  3.47*  CALCIUM 8.4*  --  9.2  MG 2.4 2.2 2.8*  PHOS 3.4 2.3* 3.2    Lipid Panel:     Component Value Date/Time   CHOL 105 01/05/2019 0622   TRIG 101 01/05/2019 0622   HDL 29 (L) 01/05/2019 0622   CHOLHDL 3.6 01/05/2019 0622   VLDL 20 01/05/2019 0622   LDLCALC 56 01/05/2019 0622   HgbA1c:  Lab Results  Component Value Date   HGBA1C 6.1 (H) 01/05/2019   Urine Drug Screen: No results found for: LABOPIA, COCAINSCRNUR, LABBENZ, AMPHETMU, THCU, LABBARB  Alcohol Level No results found for: [facility]  IMAGING  CT Code Stroke CTA Head W/WO contrast 01/03/2019 IMPRESSION:  1. Extensive calcified plaque at both carotid bifurcations. 80% or greater stenosis of the proximal ICA on the right.  2. 20% stenosis of the proximal ICA on the left.  3. Calcified plaque at both vertebral artery origins but without flow limiting stenosis.  4. No intracranial large or  medium vessel occlusion or correctable proximal stenosis.  5. Pulmonary infiltrates right worse than left consistent with viral pneumonia.   CT HEAD WO CONTRAST 01/03/2019 IMPRESSION:  1. Nondiagnostic study due to extensive motion.  2. Atrophy and chronic microvascular ischemia.   CT Head WO Contrast 01/04/2019 IMPRESSION: No acute abnormality. Atrophy and chronic microvascular ischemic change in the white matter. Image quality degraded by motion  ECHOCARDIOGRAM COMPLETE 01/02/2019 IMPRESSIONS   1. Left ventricular ejection fraction, by visual estimation, is 40 to 45%. The left ventricle has mildly decreased function. There is no left ventricular hypertrophy.   2. The left ventricle demonstrates global hypokinesis.   3. Global right ventricle has mildly reduced systolic function.The right ventricular size is normal. No increase in right ventricular wall thickness.   4. Left atrial size was normal.   5. Right atrial size was normal.   6. Presence of pericardial fat pad.   7. The mitral valve is degenerative. Mild mitral valve regurgitation.   8. The tricuspid valve is grossly normal. Tricuspid valve regurgitation is trivial.   9. The aortic valve is  tricuspid. Aortic valve regurgitation is not visualized. Mild to moderate aortic valve sclerosis/calcification without any evidence of aortic stenosis.  10. The pulmonic valve was grossly normal. Pulmonic valve regurgitation is not visualized.  11. TR signal is inadequate for assessing pulmonary artery systolic pressure.  12. No prior Echocardiogram.    ECG - atrial fibrillation - ventricular response 80 BPM (See cardiology reading for complete details)   PHYSICAL EXAM Blood pressure 113/70, pulse (!) 27, temperature 97.9 F (36.6 C), temperature source Axillary, resp. rate (!) 26, height 5\' 8"  (1.727 m), weight 65.4 kg, SpO2 91 %. Frail cachectic malnourished looking elderly Caucasian male in significant respiratory distress on  nonrebreather. Afebrile. Head is nontraumatic. Neck is supple without bruit.    Cardiac exam no murmur or gallop, however, irregular heart rhythm with frequent PACs PVCs. Lungs are clear to auscultation. Distal pulses are well felt.  Neurological Exam Patient is stuporous but able to open eyes with painful stimuli. Nonverbal and not following commands. He has right gaze preference and will not look to the left.  Pupils are equal and reactive.  Doll's eye movements not able to perform due to neck stiffness and increased tone.  Left facial weakness.  Tongue protrusion not cooperative. Patient has spontaneous right upper and lower extremity movements on pain but has left hemiplegia with only trace withdrawal to noxious stimuli in the foot.  Tone is diminished on the left compared to the right.  Left plantars upgoing right is downgoing. Sensation, coordination and gait not tested.   Assessment and Plan: [name] is a 85 y.o. male with history of CAD and Covid pneumonia presenting with acute onset of left hemineglect, left sided weakness, rightward gaze deviation. He received IV t-PA Thursday 01/03/19 @ 1830  Stroke: Right MCA infarct s/p IV TPA - etiology could be due to right proximal ICA high grade stenosis, or PAF seen on EKG, or COVID related hypercoagulability  Resultant right gaze deviation left hemiplegia and left hemineglect  CT head - Nondiagnostic study due to extensive motion.   CT Head - 01/04/2019 - No acute abnormality. Atrophy and chronic microvascular ischemic change in the white matter. Image quality degraded by motion  CTA H&N - Extensive calcified plaque at both carotid siphons. R>L. 80% or greater stenosis of the proximal ICA on the right.   2D Echo - EF 40 - 45%. No cardiac source of emboli identified.   Sars Corona Virus 2 - positive  LDL - 56  HgbA1c - 6.1  VTE prophylaxis - heparin drip now  aspirin 81 mg daily prior to admission, now on heparin drip due to  high D-dimer with stroke in the setting of COVID infection and no large stroke on CT repeat  Ongoing aggressive stroke risk factor management  Therapy recommendations:  SNF  Disposition:  Pending  Dr. 14/11/2018 has discussed with daughter over the phone, family so far asking for full code but willing to have further discussion  Covid pneumonia  CT and neck showed pulmonary infiltrates right worse than the left consistent with viral pneumonia  C-reactive protein - 7.2->6.4  D-dimer - 2.29- >20  Ferritin - 1,368->1428  LDH - 381  On Decadron, Remdesivir  Afebrile  WBCs - 7.6->16.5 (on Decadron)  O2 Sats - 80's to 90's  CCM on board  On nonrebreather  Paroxysmal Atrial Fibrillation  ECG 12/30/2018 concerning for A. fib  New diagnosis  Now on heparin IV  Rate controlled  On metoprolol  Right ICA  high-grade stenosis  CTA head neck showed right ICA bulb 80% stenosis  [place] to explain this symptomatic right MCA stroke  However patient not candidate for revascularization procedure at this time  BP goal 130-160 currently  Avoid low BP  Acute renal failure  creatinine - 0.89->3.47  minimal urine output over night   albumin given  Hypertension  Home BP meds: metoprolol  Current BP meds: metoprolol  Stable . Permissive hypertension (OK if <180/105) but gradually normalize in 5-7 days   . Long-term BP goal 130-150 given right ICA high-grade stenosis  Hyperlipidemia  Home Lipid lowering medication: Lipitor 20 mg daily  LDL - 56, goal < 70  Current lipid lowering medication: Lipitor 20 mg daily  Continue statin at discharge  Other Stroke Risk Factors  Advanced age  Former cigarette smoker - quit  Coronary artery disease/MI  Status post CABG  Other Active Problems  COPD - asbestos exposure  Covid pneumonia - respiratory failure  Thrombocytopenia - 167->65   Hospital day # 4  This patient is critically ill and at significant risk of  neurological worsening, death and care requires constant monitoring of vital signs, hemodynamics,respiratory and cardiac monitoring, extensive review of multiple databases, frequent neurological assessment, discussion with family, other specialists and medical decision making of high complexity. I spent 40 minutes of neurocritical care time  in the care of  this patient.  I discussed with Dr. Ander Slade.  Rosalin Hawking, MD PhD Stroke Neurology 01/05/2019 5:22 PM   To contact Stroke Continuity provider, please refer to [email or web address] After hours, contact General Neurology

## 2019-01-05 NOTE — Progress Notes (Signed)
Daughter called for updates and sister requesting video chat. Daughter informed that they have plans to make the patient comfort care/end of life tomorrow.

## 2019-01-05 NOTE — Progress Notes (Signed)
MD notified that pt has had no urine output this shift, with only 10mLs on bladder scan.  Breathing more labored than on previous assessments, 02 sats remain above goal of 85% however pt appears more uncomfortable. MD aware.   Neuro exam unchanged, globally aphasic, plegic on left.

## 2019-01-05 NOTE — Progress Notes (Signed)
ANTICOAGULATION CONSULT NOTE - Initial Consult  Pharmacy Consult for heparin Indication: r/o VTE  Allergies  Allergen Reactions  . Chlorhexidine Gluconate [Chlorhexidine] Rash    Patient Measurements: Height: 5\' 8"  (172.7 cm) Weight: 144 lb 2.9 oz (65.4 kg) IBW/kg (Calculated) : 68.4 Heparin Dosing Weight:   Vital Signs: Temp: 97.8 F (36.6 C) (12/12 0800) Temp Source: Axillary (12/12 0800) BP: 105/73 (12/12 1000) Pulse Rate: 98 (12/12 0900)  Labs: Recent Labs    01/03/19 0405 01/05/19 0622  HGB 14.9 14.6  HCT 46.9 44.6  PLT 167 65*  CREATININE  --  3.47*    Estimated Creatinine Clearance: 13.6 mL/min (A) (by C-G formula based on SCr of 3.47 mg/dL (H)).   Medical History: Past Medical History:  Diagnosis Date  . AAA (abdominal aortic aneurysm) (HCC)   . Arthritis   . Asthma   . BPH with elevated PSA   . CAD (coronary artery disease)    Prior MI with CABG in 2010  . COPD (chronic obstructive pulmonary disease) (HCC)   . H/O: asbestos exposure   . Hemidiaphragm paralysis    RIGHT HEMIDIAPHRAGM  . Herpes 02/2008  . LV dysfunction    EF 55% per echo in 8/12  . OA (osteoarthritis)   . SOB (shortness of breath)     Medications:  Medications Prior to Admission  Medication Sig Dispense Refill Last Dose  . albuterol (VENTOLIN HFA) 108 (90 Base) MCG/ACT inhaler Inhale 2 puffs into the lungs every 6 (six) hours as needed for wheezing or shortness of breath.   unk  . aspirin 81 MG tablet Take 81 mg by mouth daily.     12/30/2018  . atorvastatin (LIPITOR) 20 MG tablet Take 20 mg by mouth every evening.    12/30/2018  . diclofenac sodium (VOLTAREN) 1 % GEL Apply topically as needed.     Past Week at Unknown time  . docusate sodium (DULCOLAX) 100 MG capsule Take 100 mg by mouth as needed (constipation).    Past Week at Unknown time  . fish oil-omega-3 fatty acids 1000 MG capsule Take 1,200 mg by mouth 2 (two) times daily.     12/30/2018  . metoprolol tartrate  (LOPRESSOR) 25 MG tablet TAKE 1 TABLET (25 MG TOTAL) BY MOUTH 2 (TWO) TIMES DAILY. (Patient taking differently: Take 25 mg by mouth 2 (two) times daily. ) 60 tablet 2 12/30/2018 at 1800  . Multiple Vitamin (MULTIVITAMIN) tablet Take 1 tablet by mouth daily.     Past Week at Unknown time  . TRELEGY ELLIPTA 100-62.5-25 MCG/INH AEPB Inhale 1 puff into the lungs daily.   12/30/2018  . atorvastatin (LIPITOR) 20 MG tablet Take 20 mg by mouth daily.   Not Taking at Unknown time  . Fluticasone Furoate-Vilanterol (BREO ELLIPTA) 100-25 MCG/INH AEPB Inhale into the lungs.   Not Taking at Unknown time    Assessment: 85 y/o male who was admitted with weakness and SOB who tested positive for COVID. Code stroke called on 12/10 and he received tPA on 12/10. CT was without acute lesion, unable to complete MRI. Pharmacy consulted to begin IV heparin for r/o VTE.   AKI noted, SCr up to 3.47, d-dimer was <5 and is now >20. No bleeding noted, CBC is normal.  Goal of Therapy:  Heparin level 0.3-0.5 units/ml Monitor platelets by anticoagulation protocol: Yes   Plan:  No heparin bolus due to recent stroke and tPA Begin heparin drip at 1050 units/hr 8h heparin level Daily heparin level,  CBC Monitor for s/sx of bleeding   Thank you for involving pharmacy in this patient's care.  Renold Genta, PharmD, BCPS Clinical Pharmacist Clinical phone for 01/05/2019 until 3p is [phone] 01/05/2019 12:00 PM  **Pharmacist phone directory can be found on [email or web address] listed under [place]**

## 2019-01-05 NOTE — Progress Notes (Signed)
SLP Cancellation Note  Patient Details Name: Jermaine Wright MRN: [id number] DOB: August 03, 1928   Cancelled treatment:       Reason Eval/Treat Not Completed: Medical issues which prohibited therapy; pt on NRB, not following commands per RN.  SLP will follow for appropriateness for eval - prognosis is poor per chart review.  Jermaine Wright L. Tivis Ringer, [place] CCC/SLP Acute Rehabilitation Services Office number [phone] Pager [phone]    Jermaine Wright 01/05/2019, 10:35 AM

## 2019-01-06 DIAGNOSIS — G9349 Other encephalopathy: Secondary | ICD-10-CM

## 2019-01-06 DIAGNOSIS — N170 Acute kidney failure with tubular necrosis: Secondary | ICD-10-CM

## 2019-01-06 DIAGNOSIS — J9601 Acute respiratory failure with hypoxia: Secondary | ICD-10-CM

## 2019-01-06 LAB — CBC
HCT: 41 % (ref 39.0–52.0)
Hemoglobin: 13.6 g/dL (ref 13.0–17.0)
MCH: 28.6 pg (ref 26.0–34.0)
MCHC: 33.2 g/dL (ref 30.0–36.0)
MCV: 86.1 fL (ref 80.0–100.0)
Platelets: 86 10*3/uL — ABNORMAL LOW (ref 150–400)
RBC: 4.76 MIL/uL (ref 4.22–5.81)
RDW: 15 % (ref 11.5–15.5)
WBC: 20.7 10*3/uL — ABNORMAL HIGH (ref 4.0–10.5)
nRBC: 0.1 % (ref 0.0–0.2)

## 2019-01-06 LAB — CULTURE, BLOOD (ROUTINE X 2)
Culture: NO GROWTH
Culture: NO GROWTH

## 2019-01-06 LAB — COMPREHENSIVE METABOLIC PANEL
ALT: 70 U/L — ABNORMAL HIGH (ref 0–44)
AST: 119 U/L — ABNORMAL HIGH (ref 15–41)
Albumin: 3.1 g/dL — ABNORMAL LOW (ref 3.5–5.0)
Alkaline Phosphatase: 150 U/L — ABNORMAL HIGH (ref 38–126)
Anion gap: 15 (ref 5–15)
BUN: 88 mg/dL — ABNORMAL HIGH (ref 8–23)
CO2: 23 mmol/L (ref 22–32)
Calcium: 8.6 mg/dL — ABNORMAL LOW (ref 8.9–10.3)
Chloride: 108 mmol/L (ref 98–111)
Creatinine, Ser: 5.01 mg/dL — ABNORMAL HIGH (ref 0.61–1.24)
GFR calc Af Amer: 11 mL/min — ABNORMAL LOW (ref >60)
GFR calc non Af Amer: 10 mL/min — ABNORMAL LOW (ref >60)
Glucose, Bld: 231 mg/dL — ABNORMAL HIGH (ref 70–99)
Potassium: 4.6 mmol/L (ref 3.5–5.1)
Sodium: 146 mmol/L — ABNORMAL HIGH (ref 135–145)
Total Bilirubin: 1.4 mg/dL — ABNORMAL HIGH (ref 0.3–1.2)
Total Protein: 5.8 g/dL — ABNORMAL LOW (ref 6.5–8.1)

## 2019-01-06 LAB — D-DIMER, QUANTITATIVE: D-Dimer, Quant: >20 ug/mL-FEU — ABNORMAL HIGH (ref 0.00–0.50)

## 2019-01-06 LAB — PHOSPHORUS: Phosphorus: 2.9 mg/dL (ref 2.5–4.6)

## 2019-01-06 LAB — C-REACTIVE PROTEIN: CRP: 6 mg/dL — ABNORMAL HIGH (ref <1.0)

## 2019-01-06 LAB — FERRITIN: Ferritin: 1455 ng/mL — ABNORMAL HIGH (ref 24–336)

## 2019-01-06 LAB — MAGNESIUM: Magnesium: 2.7 mg/dL — ABNORMAL HIGH (ref 1.7–2.4)

## 2019-01-06 LAB — HEPARIN LEVEL (UNFRACTIONATED): Heparin Unfractionated: 0.67 IU/mL (ref 0.30–0.70)

## 2019-01-06 MED ORDER — GLYCOPYRROLATE 0.2 MG/ML IJ SOLN: 0.2000 mg | INTRAMUSCULAR | Status: DC | PRN

## 2019-01-06 MED ORDER — DIPHENHYDRAMINE HCL 50 MG/ML IJ SOLN: 25.0000 mg | INTRAMUSCULAR | Status: DC | PRN

## 2019-01-06 MED ORDER — ACETAMINOPHEN 325 MG PO TABS: 650.0000 mg | ORAL_TABLET | Freq: Four times a day (QID) | ORAL | Status: DC | PRN

## 2019-01-06 MED ORDER — ACETAMINOPHEN 650 MG RE SUPP: 650.0000 mg | Freq: Four times a day (QID) | RECTAL | Status: DC | PRN

## 2019-01-06 MED ORDER — GLYCOPYRROLATE 1 MG PO TABS: 1.0000 mg | ORAL_TABLET | ORAL | Status: DC | PRN

## 2019-01-06 MED ORDER — MORPHINE SULFATE (PF) 2 MG/ML IV SOLN
2.0000 mg | INTRAVENOUS | Status: DC | PRN
  Administered 2019-01-06 (×2): 4 mg via INTRAVENOUS
  Administered 2019-01-06 (×2): 2 mg via INTRAVENOUS
  Filled 2019-01-06 (×3): qty 2

## 2019-01-06 MED ORDER — MIDAZOLAM HCL 2 MG/2ML IJ SOLN: 2.0000 mg | INTRAMUSCULAR | Status: DC | PRN

## 2019-01-06 MED ORDER — DEXTROSE 5 % IV SOLN
INTRAVENOUS | Status: DC
  Administered 2019-01-06: 12:00:00 via INTRAVENOUS

## 2019-01-06 MED ORDER — POLYVINYL ALCOHOL 1.4 % OP SOLN
1.0000 [drp] | Freq: Four times a day (QID) | OPHTHALMIC | Status: DC | PRN
  Filled 2019-01-06: qty 15
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
## 2019-01-25 NOTE — Progress Notes (Signed)
ANTICOAGULATION CONSULT NOTE  Pharmacy Consult:  Heparin Indication: r/o VTE  Allergies  Allergen Reactions  . Chlorhexidine Gluconate [Chlorhexidine] Rash    Patient Measurements: Height: 5\' 8"  (172.7 cm) Weight: 144 lb 2.9 oz (65.4 kg) IBW/kg (Calculated) : 68.4  Vital Signs: Temp: 98.1 F (36.7 C) (12/13 0400) Temp Source: Axillary (12/13 0400) BP: 129/74 (12/13 0700) Pulse Rate: 38 (12/13 0700)  Labs: Recent Labs    01/05/19 0622 01/05/19 2124 January 23, 2019 0500  HGB 14.6  --  13.6  HCT 44.6  --  41.0  PLT 65*  --  86*  APTT  --  158*  --   LABPROT  --  22.0*  --   INR  --  1.9*  --   HEPARINUNFRC  --  0.57 0.67  CREATININE 3.47*  --  5.01*    Estimated Creatinine Clearance: 9.4 mL/min (A) (by C-G formula based on SCr of 5.01 mg/dL (H)).   Assessment: 86 y/o male admitted with weakness and SOB who tested positive for COVID. Code stroke called on 12/10 and he received tPA on 12/10. CT was without acute lesion, unable to complete MRI. Pharmacy consulted to begin IV heparin for r/o VTE.   Heparin level is supra-therapeutic at 0.67. No bleeding noted, Hgb is stable, platelets are up to 86. D-dimer now >20.  Goal of Therapy:  Heparin level 0.3-0.5 units/ml Monitor platelets by anticoagulation protocol: Yes   Plan:  Reduce heparin drip to 850 units/hr 8h heparin level Daily heparin level, CBC Monitor for s/sx of bleeding F/U comfort care plans  Thank you for involving pharmacy in this patient's care.  Renold Genta, PharmD, BCPS Clinical Pharmacist Clinical phone for 2019/01/23 until 3p is [phone] January 23, 2019 7:28 AM  **Pharmacist phone directory can be found on [email or web address] listed under [place]**

## 2019-01-25 NOTE — Progress Notes (Signed)
Pt had a 12 beat run of vtac - notified Elink.  Due to status and plan, no further orders and will continue to monitor.

## 2019-01-25 NOTE — Progress Notes (Signed)
NAME:  Jermaine Wright, MRN:  [id number], DOB:  [birth year], LOS: 5 ADMISSION DATE:  01/18/2019, CONSULTATION DATE: 01/03/2019 REFERRING MD: Dr. Jerral Ralph, CHIEF COMPLAINT: Covid pneumonia, stroke  Brief History   86 year old gentleman who was admitted with complaints of weakness shortness of breath History significant for COPD, CABG in the past, coronary artery disease, right hemidiaphragm paralysis, osteoarthritis Weakness for a few days Tested positive for Covid Admitted for Covid pneumonia Noted to have gaze preference and transferred to neuro ICU Status post TPA-nonresolution of deficits CT head negative Acute kidney injury with worsening Tachyarrhythmia Updated family-goals-comfort measures  Past Medical History   Past Medical History:  Diagnosis Date  . AAA (abdominal aortic aneurysm) (HCC)   . Arthritis   . Asthma   . BPH with elevated PSA   . CAD (coronary artery disease)    Prior MI with CABG in 2010  . COPD (chronic obstructive pulmonary disease) (HCC)   . H/O: asbestos exposure   . Hemidiaphragm paralysis    RIGHT HEMIDIAPHRAGM  . Herpes 02/2008  . LV dysfunction    EF 55% per echo in 8/12  . OA (osteoarthritis)   . SOB (shortness of breath)      Significant Hospital Events   Gaze preference noted 01/03/2019 Code stroke activated 01/03/2019 Received TPA 01/03/2019 Encephalopathic Acute kidney injury  Consults:  Neurology  Procedures:  None  Significant Diagnostic Tests:  CT head IMPRESSION: 1. Extensive calcified plaque at both carotid bifurcations. 80% or greater stenosis of the proximal ICA on the right. 2. 20% stenosis of the proximal ICA on the left. 3. Calcified plaque at both vertebral artery origins but without flow limiting stenosis. 4. No intracranial large or medium vessel occlusion or correctable proximal stenosis. 5. Pulmonary infiltrates right worse than left consistent with viral pneumonia.  Repeat CT scan of the head 01/04/2019  IMPRESSION: No acute abnormality. Atrophy and chronic microvascular ischemic change in the white matter  Image quality degraded by motion Micro Data:  SARS coronavirus positive Antimicrobials:  Remdesivir  Interim history/subjective:  Admitted to [facility] Transferred to 4 N. 01/03/2019 Received TPA 01/03/2019 Anuric, acute kidney injury Updated family  Objective   Blood pressure 130/77, pulse 84, temperature (!) 97 F (36.1 C), temperature source Axillary, resp. rate (!) 22, height 5\' 8"  (1.727 m), weight 65.4 kg, SpO2 91 %.        Intake/Output Summary (Last 24 hours) at 12/25/2018 01/08/2019 Last data filed at 12/31/2018 0700 Gross per 24 hour  Intake 2513.18 ml  Output 20 ml  Net 2493.18 ml   Filed Weights   01/01/19 1412 01/04/19 0600  Weight: 78 kg 65.4 kg    Examination: General: Elderly gentleman, chronically ill-appearing HENT: Dry oral mucosa Lungs: Bowels Cardiovascular: S1-S2 appreciated Abdomen: All sounds appreciated Extremities: No clubbing, no edema Neuro: Obtunded GU: Very minimal output  Resolved Hospital Problem list     Assessment & Plan:  Covid pneumonia -Was on remdesivir, Decadron, empiric antibiotics -Patient now comfort measures   Acute hypoxemic respiratory failure -Continue oxygen supplementation -Comfort measures  Acute stroke -Did not recover with TPA -Comfort measures  Coronary artery disease -Runs of V. tach overnight  Acute kidney injury -Worsening renal parameters - History of left ventricular dysfunction -Echocardiogram reveals 40 to 45% ejection fraction  Chronic obstructive pulmonary disease -Comfort measures  Leukocytosis -Likely related to Decadron  I did call daughter to update her about Mrs. Cashin's condition -Progressive deterioration -Currently obtunded -Encephalopathic -Acute kidney injury -Hypoxic respiratory failure  secondary to Covid pneumonia  Patient will be full comfort measures  Best  practice:  Full comfort measures   Patient will be made full comfort measures today  The patient is critically ill with multiple organ systems failure and requires high complexity decision making for assessment and support, frequent evaluation and titration of therapies, application of advanced monitoring technologies and extensive interpretation of multiple databases. Critical Care Time devoted to patient care services described in this note independent of APP/resident time (if applicable)  is 30 minutes.   Sherrilyn Rist MD [facility] Personal pager: [phone] If unanswered, please page CCM On-call: [phone]

## 2019-01-25 NOTE — Progress Notes (Signed)
SLP Cancellation Note  Patient Details Name: Jermaine Wright MRN: [id number] DOB: 07-24-1928   Cancelled treatment:       Reason Eval/Treat Not Completed: Medical issues which prohibited therapy.  Reviewed chart and spoke with RN who advised to sign off given decline in medical status with plans for full comfort care. Please re consult if needed.   Rosalio Catterton MA, CCC-SLP     [name] 01/05/2019, 8:09 AM

## 2019-01-25 NOTE — Progress Notes (Signed)
Due to GCS score, DNR status and plans for comfort/end of life, I called [facility] for a referral.   [facility] Referral [phone]  Please call with cardiac time of death.  Spoke with [employer]

## 2019-01-25 NOTE — Death Summary Note (Signed)
DEATH SUMMARY   Patient Details  Name: Jermaine Wright MRN: [id number] DOB: 1928/05/28  Admission/Discharge Information   Admit Date:  01-10-19  Date of Death: Date of Death: 01-15-19  Time of Death: Time of Death: 05/21/06  Length of Stay: 5  Referring Physician: Gaspar Garbe, MD   Reason(s) for Hospitalization  Patient was admitted to the hospital secondary to worsening fatigue, weakness Found to have Covid pneumonia and admitted to [facility]  Diagnoses  Preliminary cause of death: Hypoxemic respiratory failure Secondary Diagnoses (including complications and co-morbidities):  Active Problems:   Pneumonia due to COVID-19 virus   Cerebral thrombosis with cerebral infarction Acute kidney injury Chronic obstructive pulmonary disease  Brief Hospital Course (including significant findings, care, treatment, and services provided and events leading to death)  Jermaine Wright is a 86 y.o. year old male who admitted for fatigue and weakness, found to have Covid pneumonia Admitted to [facility], was being treated with steroids on remdesivir. Developed gaze preference and weakness on the left, received TPA Respiratory status continued to deteriorate, associated agitation Started on anticoagulation secondary to markedly elevated D-dimer Worsening renal functions noted Despite ongoing aggressive measures, patient continued to deteriorate and developed multiorgan dysfunction Encephalopathy, hypoxemic respiratory failure, acute kidney injury Following discussions with family members, it was decided that in the best interest of the patient, comfort measures is appropriate He succumbed to his illness at 34, 15-Jan-2019    Pertinent Labs and Studies  Significant Diagnostic Studies CT Code Stroke CTA Head W/WO contrast  Result Date: 01/03/2019 CLINICAL DATA:  Concern for right brain stroke. Coronavirus infection. EXAM: CT ANGIOGRAPHY HEAD AND NECK TECHNIQUE: Multidetector CT imaging of  the head and neck was performed using the standard protocol during bolus administration of intravenous contrast. Multiplanar CT image reconstructions and MIPs were obtained to evaluate the vascular anatomy. Carotid stenosis measurements (when applicable) are obtained utilizing NASCET criteria, using the distal internal carotid diameter as the denominator. CONTRAST:  53mL OMNIPAQUE IOHEXOL 350 MG/ML SOLN COMPARISON:  Motion degraded head CT same day. FINDINGS: CTA NECK FINDINGS Aortic arch: Aortic atherosclerosis. No aneurysm. Brachiocephalic vessel origins not included. Right carotid system: Common carotid artery is patent to the bifurcation. There is extensive calcified plaque at the carotid bifurcation and ICA bulb. Because of motion and streak artifact, precise measurement is difficult, but I think the lumen is 1 mm or less at the proximal ICA, consistent with 80% or greater stenosis. Distal to that, the ICA does show flow to the skull base. Left carotid system: Common carotid artery is patent to the bifurcation region. Dense calcified plaque at the carotid bifurcation and ICA bulb. Minimal diameter of the ICA bulb is 3.5 mm. Compared to a more distal cervical ICA diameter of 4 mm, this indicates a 20% stenosis. Cervical ICA widely patent beyond that. Vertebral arteries: Right vertebral artery is dominant. Calcified plaque at both vertebral artery origins but without suspicion of flow limiting stenosis. Both vessels appear widely patent through the cervical region to the foramen magnum. Skeleton: Chronic cervical spondylosis. Other neck: No mass or lymphadenopathy. Upper chest: Pulmonary infiltrates right worse than left consistent with viral pneumonia. Review of the MIP images confirms the above findings CTA HEAD FINDINGS Anterior circulation: Both internal carotid arteries are patent through the skull base and siphon regions. There is atherosclerotic calcification in both carotid siphon regions with stenosis  estimated at 50%. Both anterior and middle cerebral vessels are patent. No large or medium vessel occlusion identified. Posterior circulation:  Both vertebral arteries are patent through the foramen magnum. The left terminates in PICA. Right vertebral artery shows calcified plaque in the V4 segment but no stenosis greater than 20%. No basilar stenosis. Posterior circulation branch vessels are patent. Venous sinuses: Patent and normal. Anatomic variants: None significant. Review of the MIP images confirms the above findings IMPRESSION: 1. Extensive calcified plaque at both carotid bifurcations. 80% or greater stenosis of the proximal ICA on the right. 2. 20% stenosis of the proximal ICA on the left. 3. Calcified plaque at both vertebral artery origins but without flow limiting stenosis. 4. No intracranial large or medium vessel occlusion or correctable proximal stenosis. 5. Pulmonary infiltrates right worse than left consistent with viral pneumonia. Electronically Signed   By: Paulina Fusi M.D.   On: 01/03/2019 18:36   CT HEAD WO CONTRAST  Result Date: 01/04/2019 CLINICAL DATA:  Stroke follow-up EXAM: CT HEAD WITHOUT CONTRAST TECHNIQUE: Contiguous axial images were obtained from the base of the skull through the vertex without intravenous contrast. COMPARISON:  CT head 01/03/2019 FINDINGS: Brain: Image quality degraded by motion although the current study is of better quality than the prior study. Moderate to advanced atrophy. Diffuse white matter disease appears chronic. No acute cortical infarct. No acute hemorrhage or mass. No midline shift. Vascular: Atherosclerotic calcification in the cavernous carotid bilaterally. Negative for hyperdense vessel Skull: Negative Sinuses/Orbits: Paranasal sinuses clear.  Bilateral cataract surgery Other: None IMPRESSION: No acute abnormality. Atrophy and chronic microvascular ischemic change in the white matter Image quality degraded by motion Electronically Signed   By:  Marlan Palau M.D.   On: 01/04/2019 19:24   CT HEAD WO CONTRAST  Result Date: 01/03/2019 CLINICAL DATA:  Neuro deficit acute. Rule out stroke. EXAM: CT HEAD WITHOUT CONTRAST TECHNIQUE: Contiguous axial images were obtained from the base of the skull through the vertex without intravenous contrast. COMPARISON:  CT head 06/20/2008 FINDINGS: Brain: Image quality degraded by extensive motion. The scan was repeated with similar results. The study is largely nondiagnostic. There is generalized atrophy. Diffuse white matter hypodensity compatible with chronic ischemia. No definite acute abnormality. Repeat study suggested. IMPRESSION: 1. Nondiagnostic study due to extensive motion. 2. Atrophy and chronic microvascular ischemia. Electronically Signed   By: Marlan Palau M.D.   On: 01/03/2019 17:10   CT Code Stroke CTA Neck W/WO contrast  Result Date: 01/03/2019 CLINICAL DATA:  Concern for right brain stroke. Coronavirus infection. EXAM: CT ANGIOGRAPHY HEAD AND NECK TECHNIQUE: Multidetector CT imaging of the head and neck was performed using the standard protocol during bolus administration of intravenous contrast. Multiplanar CT image reconstructions and MIPs were obtained to evaluate the vascular anatomy. Carotid stenosis measurements (when applicable) are obtained utilizing NASCET criteria, using the distal internal carotid diameter as the denominator. CONTRAST:  75mL OMNIPAQUE IOHEXOL 350 MG/ML SOLN COMPARISON:  Motion degraded head CT same day. FINDINGS: CTA NECK FINDINGS Aortic arch: Aortic atherosclerosis. No aneurysm. Brachiocephalic vessel origins not included. Right carotid system: Common carotid artery is patent to the bifurcation. There is extensive calcified plaque at the carotid bifurcation and ICA bulb. Because of motion and streak artifact, precise measurement is difficult, but I think the lumen is 1 mm or less at the proximal ICA, consistent with 80% or greater stenosis. Distal to that, the ICA  does show flow to the skull base. Left carotid system: Common carotid artery is patent to the bifurcation region. Dense calcified plaque at the carotid bifurcation and ICA bulb. Minimal diameter of the ICA bulb  is 3.5 mm. Compared to a more distal cervical ICA diameter of 4 mm, this indicates a 20% stenosis. Cervical ICA widely patent beyond that. Vertebral arteries: Right vertebral artery is dominant. Calcified plaque at both vertebral artery origins but without suspicion of flow limiting stenosis. Both vessels appear widely patent through the cervical region to the foramen magnum. Skeleton: Chronic cervical spondylosis. Other neck: No mass or lymphadenopathy. Upper chest: Pulmonary infiltrates right worse than left consistent with viral pneumonia. Review of the MIP images confirms the above findings CTA HEAD FINDINGS Anterior circulation: Both internal carotid arteries are patent through the skull base and siphon regions. There is atherosclerotic calcification in both carotid siphon regions with stenosis estimated at 50%. Both anterior and middle cerebral vessels are patent. No large or medium vessel occlusion identified. Posterior circulation: Both vertebral arteries are patent through the foramen magnum. The left terminates in PICA. Right vertebral artery shows calcified plaque in the V4 segment but no stenosis greater than 20%. No basilar stenosis. Posterior circulation branch vessels are patent. Venous sinuses: Patent and normal. Anatomic variants: None significant. Review of the MIP images confirms the above findings IMPRESSION: 1. Extensive calcified plaque at both carotid bifurcations. 80% or greater stenosis of the proximal ICA on the right. 2. 20% stenosis of the proximal ICA on the left. 3. Calcified plaque at both vertebral artery origins but without flow limiting stenosis. 4. No intracranial large or medium vessel occlusion or correctable proximal stenosis. 5. Pulmonary infiltrates right worse than  left consistent with viral pneumonia. Electronically Signed   By: Paulina FusiMark  Shogry M.D.   On: 01/03/2019 18:36   DG Chest Port 1 View  Result Date: 01/03/2019 CLINICAL DATA:  Hypoxic, COVID positive EXAM: PORTABLE CHEST 1 VIEW COMPARISON:  Radiograph 07/25/2008 FINDINGS: Sternotomy wires overlie obscured cardiac silhouette. Low lung volumes with elevation of the RIGHT hemidiaphragm. There is perihilar airspace disease which obscures the cardiac shadow. No pneumothorax. Severe degenerate changes of the shoulders. Gaseous distention of the colon IMPRESSION: Bibasilar airspace disease in a pattern typical of viral pneumonia (COVID pneumonia). Low lung volumes. Electronically Signed   By: Genevive BiStewart  Edmunds M.D.   On: 01/11/2019 13:45   ECHOCARDIOGRAM COMPLETE  Result Date: 01/02/2019   ECHOCARDIOGRAM REPORT   Patient Name:   Shirlean SchleinJOHN B Knaggs Date of Exam: 01/02/2019 Medical Rec #:  [id number]      Height:       68.0 in Accession #:    [id number]     Weight:       172.0 lb Date of Birth:  09/19/1928      BSA:          1.92 m Patient Age:    89 years       BP:           93/55 mmHg Patient Gender: M              HR:           64 bpm. Exam Location:  Inpatient Procedure: 2D Echo, Cardiac Doppler and Color Doppler Indications:    I48.91* Unspeicified atrial fibrillation  History:        Patient has prior history of Echocardiogram examinations, most                 recent 09/17/2010. CAD, Abnormal ECG; Arrythmias:Atrial                 Fibrillation. Covid positive. Pneumonia. LV dysfunction. ETOH.  Sonographer:    Inetta Fermoina  [facility] RDCS Referring Phys: [id number] ARAVIND CHANDRA  Sonographer Comments: Technically difficult study due to poor echo windows. In chair. Study was extremely difficult because patient was in chair. IMPRESSIONS  1. Left ventricular ejection fraction, by visual estimation, is 40 to 45%. The left ventricle has mildly decreased function. There is no left ventricular hypertrophy.  2. The left ventricle demonstrates  global hypokinesis.  3. Global right ventricle has mildly reduced systolic function.The right ventricular size is normal. No increase in right ventricular wall thickness.  4. Left atrial size was normal.  5. Right atrial size was normal.  6. Presence of pericardial fat pad.  7. The mitral valve is degenerative. Mild mitral valve regurgitation.  8. The tricuspid valve is grossly normal. Tricuspid valve regurgitation is trivial.  9. The aortic valve is tricuspid. Aortic valve regurgitation is not visualized. Mild to moderate aortic valve sclerosis/calcification without any evidence of aortic stenosis. 10. The pulmonic valve was grossly normal. Pulmonic valve regurgitation is not visualized. 11. TR signal is inadequate for assessing pulmonary artery systolic pressure. 12. No prior Echocardiogram. FINDINGS  Left Ventricle: Left ventricular ejection fraction, by visual estimation, is 40 to 45%. The left ventricle has mildly decreased function. The left ventricle demonstrates global hypokinesis. The left ventricular internal cavity size was the left ventricle is normal in size. There is no left ventricular hypertrophy. The left ventricular diastology could not be evaluated due to nondiagnostic images. Right Ventricle: The right ventricular size is normal. No increase in right ventricular wall thickness. Global RV systolic function is has mildly reduced systolic function. Left Atrium: Left atrial size was normal in size. Right Atrium: Right atrial size was normal in size Pericardium: There is no evidence of pericardial effusion. Presence of pericardial fat pad. Mitral Valve: The mitral valve is degenerative in appearance. Mild mitral valve regurgitation. Tricuspid Valve: The tricuspid valve is grossly normal. Tricuspid valve regurgitation is trivial. Aortic Valve: The aortic valve is tricuspid. Aortic valve regurgitation is not visualized. Mild to moderate aortic valve sclerosis/calcification is present, without any  evidence of aortic stenosis. Pulmonic Valve: The pulmonic valve was grossly normal. Pulmonic valve regurgitation is not visualized. Pulmonic regurgitation is not visualized. Aorta: The aortic root is normal in size and structure. Venous: The inferior vena cava was not well visualized. IAS/Shunts: No atrial level shunt detected by color flow Doppler.  LEFT VENTRICLE PLAX 2D LVIDd:         4.84 cm LVIDs:         4.23 cm LV PW:         1.15 cm LV IVS:        1.02 cm LVOT diam:     2.40 cm LV SV:         30 ml LV SV Index:   15.26 LVOT Area:     4.52 cm  LV Volumes (MOD) LV area d, A2C:    16.40 cm LV area d, A4C:    24.30 cm LV area s, A2C:    12.50 cm LV area s, A4C:    19.30 cm LV major d, A2C:   4.91 cm LV major d, A4C:   6.33 cm LV major s, A2C:   4.83 cm LV major s, A4C:   5.61 cm LV vol d, MOD A2C: 45.5 ml LV vol d, MOD A4C: 76.3 ml LV vol s, MOD A2C: 29.7 ml LV vol s, MOD A4C: 57.4 ml LV SV MOD A2C:     15.8 ml LV SV MOD  A4C:     76.3 ml LV SV MOD BP:      22.7 ml RIGHT VENTRICLE RV S prime:     11.50 cm/s TAPSE (M-mode): 1.2 cm LEFT ATRIUM             Index       RIGHT ATRIUM           Index LA diam:        4.20 cm 2.19 cm/m  RA Area:     14.20 cm LA Vol (A2C):   45.4 ml 23.68 ml/m RA Volume:   27.90 ml  14.56 ml/m LA Vol (A4C):   57.6 ml 30.05 ml/m LA Biplane Vol: 54.1 ml 28.22 ml/m  AORTIC VALVE LVOT Vmax:   83.50 cm/s LVOT Vmean:  56.800 cm/s LVOT VTI:    0.159 m  AORTA Ao Root diam: 3.20 cm Ao Asc diam:  3.30 cm MITRAL VALVE MV Area (PHT): 2.83 cm             SHUNTS MV PHT:        77.72 msec           Systemic VTI:  0.16 m MV Decel Time: 268 msec             Systemic Diam: 2.40 cm MV E velocity: 52.90 cm/s 103 cm/s MV A velocity: 67.90 cm/s 70.3 cm/s MV E/A ratio:  0.78       1.5  Eleonore Chiquito MD Electronically signed by Eleonore Chiquito MD Signature Date/Time: 01/02/2019/4:49:17 PM    Final     Microbiology Recent Results (from the past 240 hour(s))  Culture, blood (routine x 2)     Status:  None   Collection Time: 01/08/2019 11:48 AM   Specimen: BLOOD LEFT HAND  Result Value Ref Range Status   Specimen Description   Final    BLOOD LEFT HAND Performed at [facility], [place] [address], [place], [place] [address]    Special Requests   Final    BOTTLES DRAWN AEROBIC AND ANAEROBIC Blood Culture results may not be optimal due to an inadequate volume of blood received in culture bottles Performed at [place] [address], [place], [place] [address]    Culture   Final    NO GROWTH 5 DAYS Performed at [facility], [place] [address], [place], [place] [address]    Report Status 01/12/2019 FINAL  Final  Urine culture     Status: None   Collection Time: 12/29/2018 11:49 AM   Specimen: Urine, Clean Catch  Result Value Ref Range Status   Specimen Description   Final    URINE, CLEAN CATCH Performed at [facility], [place] [address], [place], [place] [address]    Special Requests   Final    NONE Performed at [facility], [place] [address] [place], [place] [address]    Culture   Final    NO GROWTH Performed at [facility], [place] [address], [place], [place] [address]    Report Status 01/02/2019 FINAL  Final  Culture, blood (routine x 2)     Status: None   Collection Time: 12/26/2018 11:52 AM   Specimen: BLOOD RIGHT HAND  Result Value Ref Range Status   Specimen Description   Final    BLOOD RIGHT HAND Performed at [place] [address] [place],  [address]    Special Requests   Final  BOTTLES DRAWN AEROBIC AND ANAEROBIC Blood Culture results may not be optimal due to an inadequate volume of blood received in culture bottles Performed at [facility], [address], [place], [place] [address]    Culture   Final    NO GROWTH 5 DAYS Performed at [facility], [address] [place], [place] [address]    Report Status 01/10/2019 FINAL   Final  MRSA PCR Screening     Status: None   Collection Time: 01/02/19  6:20 AM   Specimen: Nasopharyngeal  Result Value Ref Range Status   MRSA by PCR NEGATIVE NEGATIVE Final    Comment:        The GeneXpert MRSA Assay (FDA approved for NASAL specimens only), is one component of a comprehensive MRSA colonization surveillance program. It is not intended to diagnose MRSA infection nor to guide or monitor treatment for MRSA infections. Performed at [facility], [address], [place], [place] [address]     Lab Basic Metabolic Panel: Recent Labs  Lab January 03, 2019 1148 01/02/19 0545 01/03/19 0405 01/05/19 0622 12/26/2018 0500  NA 139 143  --  151* 146*  K 3.9 4.4  --  4.9 4.6  CL 105 107  --  111 108  CO2 23 26  --  24 23  GLUCOSE 94 127*  --  159* 231*  BUN 29* 27*  --  68* 88*  CREATININE 1.06 0.89  --  3.47* 5.01*  CALCIUM 8.2* 8.4*  --  9.2 8.6*  MG  --  2.4 2.2 2.8* 2.7*  PHOS  --  3.4 2.3* 3.2 2.9   Liver Function Tests: Recent Labs  Lab Jan 03, 2019 1148 01/02/19 0545 01/05/19 0622 01/24/2019 0500  AST 58* 51* 115* 119*  ALT 21 20 47* 70*  ALKPHOS 60 57 122 150*  BILITOT 1.2 1.1 1.8* 1.4*  PROT 6.9 6.3* 6.2* 5.8*  ALBUMIN 3.6 3.3* 3.5 3.1*   No results for input(s): LIPASE, AMYLASE in the last 168 hours. No results for input(s): AMMONIA in the last 168 hours. CBC: Recent Labs  Lab 01/03/19 1148 01/02/19 0545 01/03/19 0405 01/05/19 0622 12/25/2018 0500  WBC 5.6 6.4 7.6 16.5* 20.7*  NEUTROABS 3.7 5.0 6.2 13.5*  --   HGB 15.3 14.7 14.9 14.6 13.6  HCT 48.2 46.6 46.9 44.6 41.0  MCV 89.8 90.3 89.0 87.3 86.1  PLT 124* 121* 167 65* 86*   Cardiac Enzymes: No results for input(s): CKTOTAL, CKMB, CKMBINDEX, TROPONINI in the last 168 hours. Sepsis Labs: Recent Labs  Lab 2019-01-03 1148 03-Jan-2019 1309 01/02/19 0545 01/03/19 0405 01/05/19 0622 12/29/2018 0500  PROCALCITON  --  <0.10 <0.10 <0.10  --   --   WBC 5.6  --  6.4 7.6 16.5* 20.7*   LATICACIDVEN 1.7  --   --   --   --   --     Procedures/Operations    Shukri Nistler A Rayson Rando 01/19/2019, 3:53 PM

## 2019-01-25 NOTE — Progress Notes (Signed)
Attending MD, paged and made aware of patient's passing.

## 2019-01-25 NOTE — Progress Notes (Addendum)
Noted worsening renal failure, LFTs, leukocytosis and O2 sat as well as no neuro improvement. Dr. Ander Slade updated daughter for progressive deterioration and family requested full comfort care measures. Neurology will sign off at this time. Please call with questions. Thanks for the consult.  Rosalin Hawking, MD PhD Stroke Neurology 01/14/2019 10:37 AM

## 2019-01-25 DEATH — deceased
